# Patient Record
Sex: Male | Born: 1975 | Race: White | Hispanic: No | Marital: Single | Smoking: Current every day smoker
Health system: Southern US, Community
[De-identification: ages and names within clinical notes are randomized; demographics above are authoritative.]

---

## 2018-02-10 ENCOUNTER — Emergency Department (HOSPITAL_COMMUNITY): Payer: Self-pay

## 2018-02-10 ENCOUNTER — Inpatient Hospital Stay (HOSPITAL_COMMUNITY)
Admission: EM | Admit: 2018-02-10 | Discharge: 2018-02-12 | DRG: 494 | Disposition: A | Discharge status: Home / Self Care | Payer: Self-pay | Attending: Student | Admitting: Student

## 2018-02-10 ENCOUNTER — Other Ambulatory Visit: Payer: Self-pay

## 2018-02-10 ENCOUNTER — Encounter (HOSPITAL_COMMUNITY): Payer: Self-pay | Admitting: Emergency Medicine

## 2018-02-10 DIAGNOSIS — Y92410 Unspecified street and highway as the place of occurrence of the external cause: Secondary | ICD-10-CM

## 2018-02-10 DIAGNOSIS — F17210 Nicotine dependence, cigarettes, uncomplicated: Secondary | ICD-10-CM | POA: Diagnosis present

## 2018-02-10 DIAGNOSIS — S82392A Other fracture of lower end of left tibia, initial encounter for closed fracture: Secondary | ICD-10-CM | POA: Diagnosis present

## 2018-02-10 DIAGNOSIS — Y9351 Activity, roller skating (inline) and skateboarding: Secondary | ICD-10-CM

## 2018-02-10 DIAGNOSIS — S82892A Other fracture of left lower leg, initial encounter for closed fracture: Principal | ICD-10-CM | POA: Diagnosis present

## 2018-02-10 DIAGNOSIS — Z9889 Other specified postprocedural states: Secondary | ICD-10-CM

## 2018-02-10 DIAGNOSIS — Z8781 Personal history of (healed) traumatic fracture: Secondary | ICD-10-CM

## 2018-02-10 DIAGNOSIS — S82402A Unspecified fracture of shaft of left fibula, initial encounter for closed fracture: Secondary | ICD-10-CM | POA: Diagnosis present

## 2018-02-10 DIAGNOSIS — S82202A Unspecified fracture of shaft of left tibia, initial encounter for closed fracture: Secondary | ICD-10-CM

## 2018-02-10 DIAGNOSIS — T148XXA Other injury of unspecified body region, initial encounter: Secondary | ICD-10-CM

## 2018-02-10 MED ORDER — MORPHINE SULFATE (PF) 4 MG/ML IV SOLN
4.0000 mg | Freq: Once | INTRAVENOUS | Status: AC
  Administered 2018-02-10: 4 mg via INTRAVENOUS
  Filled 2018-02-10: qty 1

## 2018-02-10 MED ORDER — HYDROMORPHONE HCL 1 MG/ML IJ SOLN
0.5000 mg | INTRAMUSCULAR | Status: AC | PRN
  Administered 2018-02-11 (×2): 0.5 mg via INTRAVENOUS
  Filled 2018-02-10 (×2): qty 1

## 2018-02-10 MED ORDER — MORPHINE SULFATE (PF) 4 MG/ML IV SOLN: 4.0000 mg | Freq: Once | INTRAVENOUS | Status: DC

## 2018-02-10 MED ORDER — SODIUM CHLORIDE 0.9 % IV SOLN
INTRAVENOUS | Status: AC
  Administered 2018-02-11: 01:00:00 via INTRAVENOUS

## 2018-02-10 MED ORDER — ONDANSETRON HCL 4 MG/2ML IJ SOLN
4.0000 mg | Freq: Three times a day (TID) | INTRAMUSCULAR | Status: AC | PRN
  Administered 2018-02-10: 4 mg via INTRAVENOUS
  Filled 2018-02-10: qty 2

## 2018-02-10 NOTE — ED Triage Notes (Signed)
Pt BIB by [facility] for a fall from a skateboard. [facility] advised he was being pulled by his dog while on a skateboard when he fell. EMS reports there was deformity to the left tib/fib area. EMS reports a 20g IV in the pts left A/C, limb splinted, and vital signs stable.   Pt reports he was hauling a** while being pulled by his dog. Pt reports minimal pain.

## 2018-02-10 NOTE — ED Provider Notes (Signed)
[facility] EMERGENCY DEPARTMENT Provider Note   CSN: [id number] Arrival date & time: 02/10/18  2109     History   Chief Complaint Chief Complaint  Patient presents with  . Skateboard Accident    HPI Philip Page is a 42 y.o. male.  The history is provided by the patient and medical records. No language interpreter was used.  Leg Pain   This is a new problem. The current episode started 1 to 2 hours ago. The problem occurs constantly. The problem has not changed since onset.The pain is present in the left lower leg. The quality of the pain is described as constant and sharp. The pain is at a severity of 10/10. The pain is severe. Pertinent negatives include no numbness and no tingling. He has tried nothing for the symptoms. The treatment provided no relief. There has been a history of trauma.    History reviewed. No pertinent past medical history.  There are no active problems to display for this patient.   History reviewed. No pertinent surgical history.      Home Medications    Prior to Admission medications   Not on File    Family History No family history on file.  Social History Social History   Tobacco Use  . Smoking status: Current Every Day Smoker    Packs/day: 0.50    Types: Cigarettes  . Smokeless tobacco: Former Network engineer Use Topics  . Alcohol use: Yes    Alcohol/week: 3.6 oz    Types: 6 Cans of beer per week  . Drug use: Never     Allergies   Patient has no allergy information on record.   Review of Systems Review of Systems  Constitutional: Negative for activity change, chills, diaphoresis, fatigue and fever.  HENT: Negative for congestion and rhinorrhea.   Eyes: Negative for visual disturbance.  Respiratory: Negative for cough, chest tightness, shortness of breath, wheezing and stridor.   Cardiovascular: Negative for chest pain, palpitations and leg swelling.  Gastrointestinal: Negative for abdominal  distention, abdominal pain, blood in stool, constipation, diarrhea, nausea and vomiting.  Genitourinary: Negative for difficulty urinating, dysuria, flank pain and frequency.  Musculoskeletal: Negative for back pain, gait problem, neck pain and neck stiffness.  Skin: Positive for wound (abrasions). Negative for rash.  Neurological: Negative for dizziness, tingling, weakness, light-headedness, numbness and headaches.  Psychiatric/Behavioral: Negative for agitation and confusion.  All other systems reviewed and are negative.    Physical Exam Updated Vital Signs BP (!) 133/93 (BP Location: Right Arm)   Pulse 83   Temp 98.1 F (36.7 C) (Oral)   Resp 16   Ht 5' 8"  (1.727 m)   Wt 72.6 kg (160 lb)   SpO2 100%   BMI 24.33 kg/m   Physical Exam  Constitutional: He is oriented to person, place, and time. He appears well-developed and well-nourished. No distress.  HENT:  Head: Normocephalic and atraumatic.  Nose: Nose normal.  Mouth/Throat: Oropharynx is clear and moist. No oropharyngeal exudate.  Eyes: Pupils are equal, round, and reactive to light. Conjunctivae and EOM are normal.  Neck: Normal range of motion.  Cardiovascular: Normal rate and intact distal pulses.  No murmur heard. Pulmonary/Chest: Effort normal and breath sounds normal. No respiratory distress. He has no wheezes. He exhibits no tenderness.  Abdominal: Soft. He exhibits no distension. There is no tenderness.  Musculoskeletal: He exhibits tenderness and deformity. He exhibits no edema.       Left lower leg: He  exhibits tenderness, bony tenderness and deformity.       Legs: Neurological: He is alert and oriented to person, place, and time. No sensory deficit. He exhibits normal muscle tone.  Skin: Capillary refill takes less than 2 seconds. He is not diaphoretic. No erythema. No pallor.  Psychiatric: He has a normal mood and affect.  Nursing note and vitals reviewed.    ED Treatments / Results  Labs (all labs  ordered are listed, but only abnormal results are displayed) Labs Reviewed  CBC WITH DIFFERENTIAL/PLATELET - Abnormal; Notable for the following components:      Result Value   WBC 13.4 (*)    RBC 4.18 (*)    Neutro Abs 10.3 (*)    All other components within normal limits  PROTIME-INR  BASIC METABOLIC PANEL    EKG EKG Interpretation  Date/Time:  Sunday February 10 2018 23:32:55 EDT Ventricular Rate:  64 PR Interval:    QRS Duration: 91 QT Interval:  405 QTC Calculation: 418 R Axis:   84 Text Interpretation:  Sinus rhythm Atrial premature complex No prior ECG for comparison.  No STEMI Confirmed by Antony Blackbird [phone]) on 02/10/2018 11:37:59 PM   Radiology Dg Tibia/fibula Left  Result Date: 02/10/2018 CLINICAL DATA:  Skateboarding accident. EXAM: LEFT TIBIA AND FIBULA - 2 VIEW COMPARISON:  None. FINDINGS: Multiple comminuted oblique fractures of the distal shaft and metaphyseal region of the left tibia. There is about 10 mm lateral displacement and 11 mm anterior displacement of the distal fracture fragments. Minimal angulation posteriorly. Comminuted oblique fractures of the distal left fibular shaft with about 8 mm lateral and 5 mm posterior displacement of the distal fracture fragments. Mild overriding. Fracture lines do not appear to extend to the ankle joint. Soft tissue swelling. IMPRESSION: Multiple comminuted oblique fractures of the distal shaft and metaphyseal region of the left tibia and of the distal shaft of the left fibula. Electronically Signed   By: Lucienne Capers M.D.   On: 02/10/2018 22:55    Procedures Procedures (including critical care time)  Medications Ordered in ED Medications  morphine 4 MG/ML injection 4 mg (4 mg Intravenous Canceled Entry 02/10/18 2348)  0.9 %  sodium chloride infusion (has no administration in time range)  HYDROmorphone (DILAUDID) injection 0.5 mg (has no administration in time range)  ondansetron (ZOFRAN) injection 4 mg (4 mg  Intravenous Canceled Entry 02/10/18 2348)  morphine 4 MG/ML injection 4 mg (4 mg Intravenous Given 02/10/18 2343)     Initial Impression / Assessment and Plan / ED Course  I have reviewed the triage vital signs and the nursing notes.  Pertinent labs & imaging results that were available during my care of the patient were reviewed by me and considered in my medical decision making (see chart for details).     Nicolaas Savo is a 42 y.o. male with no significant past medical history who presents with left leg injury from skateboarding.  Patient reports that he was having his dog pulled him on a skateboard this evening when a squirrel ran out and caused him to change directions.  Patient jumped off the skateboard and landed on his left leg which then broke.  He reports that there was significant deformity and "floppiness" of the foot.  He reports some abrasions but the wound was not an open wound.  He denies head injury chest injury back or abdominal pain.  He denies any other complaints.  On exam, patient was found to have deformity of the  left tib-fib.  Injury appeared to be closed.  Patient had normal capillary refill, sensation, and had a palpable DP and PT pulse on exam when the leg was straight.  Patient could also wiggle his toes.  Multiple abrasions on both legs present.  Lungs clear chest nontender.  X-ray obtained showing a comminuted tib/fib fracture of the left leg.  Next  Orthopedics was called and recommended admission to their service, placement of a splint, and make the patient n.p.o.  Patient also had screening preop labs and EKG performed.  Patient given pain medicine and splint was applied by orthopedic tech.  Patient admitted to orthopedic service for further management and likely surgery tomorrow.   Final Clinical Impressions(s) / ED Diagnoses   Final diagnoses:  Tibia/fibula fracture, left, closed, initial encounter  Fall from skateboard, initial encounter    ED  Discharge Orders    None      Clinical Impression: 1. Tibia/fibula fracture, left, closed, initial encounter   2. Fall from skateboard, initial encounter     Disposition: Admit  This note was prepared with assistance of Systems analyst. Occasional wrong-word or sound-a-like substitutions may have occurred due to the inherent limitations of voice recognition software.     Tegeler, Gwenyth Allegra, MD 02/11/18 0030

## 2018-02-11 ENCOUNTER — Encounter (HOSPITAL_COMMUNITY)
Admission: EM | Disposition: A | Discharge status: Home / Self Care | Payer: Self-pay | Source: Home / Self Care | Attending: Student

## 2018-02-11 ENCOUNTER — Inpatient Hospital Stay (HOSPITAL_COMMUNITY): Payer: Self-pay | Admitting: Certified Registered Nurse Anesthetist

## 2018-02-11 ENCOUNTER — Inpatient Hospital Stay (HOSPITAL_COMMUNITY): Payer: Self-pay

## 2018-02-11 ENCOUNTER — Observation Stay (HOSPITAL_COMMUNITY): Payer: Self-pay

## 2018-02-11 DIAGNOSIS — S82202A Unspecified fracture of shaft of left tibia, initial encounter for closed fracture: Secondary | ICD-10-CM | POA: Diagnosis present

## 2018-02-11 DIAGNOSIS — S82402A Unspecified fracture of shaft of left fibula, initial encounter for closed fracture: Secondary | ICD-10-CM

## 2018-02-11 HISTORY — PX: OPEN REDUCTION INTERNAL FIXATION (ORIF) TIBIA/FIBULA FRACTURE: SHX5992

## 2018-02-11 LAB — CBC WITH DIFFERENTIAL/PLATELET
Abs Immature Granulocytes: 0 K/uL (ref 0.0–0.1)
Basophils Absolute: 0 K/uL (ref 0.0–0.1)
Basophils Relative: 0 %
Eosinophils Absolute: 0.1 K/uL (ref 0.0–0.7)
Eosinophils Relative: 0 %
HCT: 40.4 % (ref 39.0–52.0)
Hemoglobin: 14.1 g/dL (ref 13.0–17.0)
Immature Granulocytes: 0 %
Lymphocytes Relative: 17 %
Lymphs Abs: 2.2 K/uL (ref 0.7–4.0)
MCH: 33.7 pg (ref 26.0–34.0)
MCHC: 34.9 g/dL (ref 30.0–36.0)
MCV: 96.7 fL (ref 78.0–100.0)
Monocytes Absolute: 0.7 K/uL (ref 0.1–1.0)
Monocytes Relative: 6 %
Neutro Abs: 10.3 K/uL — ABNORMAL HIGH (ref 1.7–7.7)
Neutrophils Relative %: 77 %
Platelets: 245 K/uL (ref 150–400)
RBC: 4.18 MIL/uL — ABNORMAL LOW (ref 4.22–5.81)
RDW: 11.6 % (ref 11.5–15.5)
WBC: 13.4 K/uL — ABNORMAL HIGH (ref 4.0–10.5)

## 2018-02-11 LAB — BASIC METABOLIC PANEL WITH GFR
Anion gap: 10 (ref 5–15)
BUN: 7 mg/dL (ref 6–20)
CO2: 26 mmol/L (ref 22–32)
Calcium: 8.8 mg/dL — ABNORMAL LOW (ref 8.9–10.3)
Chloride: 103 mmol/L (ref 98–111)
Creatinine, Ser: 0.87 mg/dL (ref 0.61–1.24)
GFR calc Af Amer: >60 mL/min
GFR calc non Af Amer: >60 mL/min
Glucose, Bld: 102 mg/dL — ABNORMAL HIGH (ref 70–99)
Potassium: 4.5 mmol/L (ref 3.5–5.1)
Sodium: 139 mmol/L (ref 135–145)

## 2018-02-11 LAB — SURGICAL PCR SCREEN
MRSA, PCR: NEGATIVE
Staphylococcus aureus: NEGATIVE

## 2018-02-11 LAB — PROTIME-INR
INR: 1.01
Prothrombin Time: 13.2 seconds (ref 11.4–15.2)

## 2018-02-11 SURGERY — OPEN REDUCTION INTERNAL FIXATION (ORIF) TIBIA/FIBULA FRACTURE
Anesthesia: General | Site: Ankle | Laterality: Left

## 2018-02-11 MED ORDER — OXYCODONE-ACETAMINOPHEN 5-325 MG PO TABS
ORAL_TABLET | ORAL | Status: AC
  Filled 2018-02-11: qty 1

## 2018-02-11 MED ORDER — KETOROLAC TROMETHAMINE 15 MG/ML IJ SOLN
15.0000 mg | Freq: Four times a day (QID) | INTRAMUSCULAR | Status: AC | PRN
  Administered 2018-02-11: 15 mg via INTRAVENOUS
  Filled 2018-02-11: qty 1

## 2018-02-11 MED ORDER — SUCCINYLCHOLINE CHLORIDE 200 MG/10ML IV SOSY
PREFILLED_SYRINGE | INTRAVENOUS | Status: AC
  Filled 2018-02-11: qty 30

## 2018-02-11 MED ORDER — ROCURONIUM BROMIDE 10 MG/ML (PF) SYRINGE
PREFILLED_SYRINGE | INTRAVENOUS | Status: DC | PRN
  Administered 2018-02-11: 65 mg via INTRAVENOUS

## 2018-02-11 MED ORDER — METHOCARBAMOL 500 MG PO TABS
500.0000 mg | ORAL_TABLET | Freq: Four times a day (QID) | ORAL | Status: DC | PRN
  Administered 2018-02-11 – 2018-02-12 (×4): 500 mg via ORAL
  Filled 2018-02-11 (×4): qty 1

## 2018-02-11 MED ORDER — HYDROMORPHONE HCL 1 MG/ML IJ SOLN
1.0000 mg | INTRAMUSCULAR | Status: DC | PRN
  Administered 2018-02-11 – 2018-02-12 (×2): 1 mg via INTRAVENOUS
  Filled 2018-02-11 (×2): qty 1

## 2018-02-11 MED ORDER — CEFAZOLIN SODIUM-DEXTROSE 2-4 GM/100ML-% IV SOLN
2.0000 g | Freq: Three times a day (TID) | INTRAVENOUS | Status: DC
  Administered 2018-02-11 – 2018-02-12 (×2): 2 g via INTRAVENOUS
  Filled 2018-02-11 (×3): qty 100

## 2018-02-11 MED ORDER — KETOROLAC TROMETHAMINE 30 MG/ML IJ SOLN
INTRAMUSCULAR | Status: DC | PRN
  Administered 2018-02-11: 30 mg via INTRAVENOUS

## 2018-02-11 MED ORDER — SUGAMMADEX SODIUM 200 MG/2ML IV SOLN
INTRAVENOUS | Status: AC
  Filled 2018-02-11: qty 2

## 2018-02-11 MED ORDER — CEFAZOLIN SODIUM-DEXTROSE 2-4 GM/100ML-% IV SOLN
2.0000 g | INTRAVENOUS | Status: AC
  Administered 2018-02-11: 2 g via INTRAVENOUS
  Filled 2018-02-11 (×2): qty 100

## 2018-02-11 MED ORDER — VANCOMYCIN HCL 1000 MG IV SOLR
INTRAVENOUS | Status: AC
  Filled 2018-02-11: qty 1000

## 2018-02-11 MED ORDER — ACETAMINOPHEN 325 MG PO TABS
650.0000 mg | ORAL_TABLET | Freq: Four times a day (QID) | ORAL | Status: DC | PRN
  Administered 2018-02-11: 650 mg via ORAL
  Filled 2018-02-11: qty 2

## 2018-02-11 MED ORDER — DEXAMETHASONE SODIUM PHOSPHATE 10 MG/ML IJ SOLN
INTRAMUSCULAR | Status: DC | PRN
  Administered 2018-02-11: 10 mg via INTRAVENOUS

## 2018-02-11 MED ORDER — ONDANSETRON HCL 4 MG/2ML IJ SOLN
INTRAMUSCULAR | Status: AC
  Filled 2018-02-11: qty 2

## 2018-02-11 MED ORDER — FENTANYL CITRATE (PF) 250 MCG/5ML IJ SOLN
INTRAMUSCULAR | Status: DC | PRN
  Administered 2018-02-11 (×5): 50 ug via INTRAVENOUS

## 2018-02-11 MED ORDER — ONDANSETRON HCL 4 MG/2ML IJ SOLN
INTRAMUSCULAR | Status: DC | PRN
  Administered 2018-02-11: 4 mg via INTRAVENOUS

## 2018-02-11 MED ORDER — ROCURONIUM BROMIDE 10 MG/ML (PF) SYRINGE
PREFILLED_SYRINGE | INTRAVENOUS | Status: AC
  Filled 2018-02-11: qty 10

## 2018-02-11 MED ORDER — KETOROLAC TROMETHAMINE 15 MG/ML IJ SOLN
15.0000 mg | Freq: Four times a day (QID) | INTRAMUSCULAR | Status: DC
  Administered 2018-02-11 – 2018-02-12 (×3): 15 mg via INTRAVENOUS
  Filled 2018-02-11 (×3): qty 1

## 2018-02-11 MED ORDER — LACTATED RINGERS IV SOLN
INTRAVENOUS | Status: DC
  Administered 2018-02-11: 12:00:00 via INTRAVENOUS

## 2018-02-11 MED ORDER — EPINEPHRINE PF 1 MG/10ML IJ SOSY
PREFILLED_SYRINGE | INTRAMUSCULAR | Status: AC
  Filled 2018-02-11: qty 20

## 2018-02-11 MED ORDER — GABAPENTIN 100 MG PO CAPS
100.0000 mg | ORAL_CAPSULE | Freq: Three times a day (TID) | ORAL | Status: DC
Start: 2018-02-11 — End: 2018-02-12
  Administered 2018-02-11 – 2018-02-12 (×3): 100 mg via ORAL
  Filled 2018-02-11 (×3): qty 1

## 2018-02-11 MED ORDER — DOUBLE ANTIBIOTIC 500-10000 UNIT/GM EX OINT
TOPICAL_OINTMENT | CUTANEOUS | Status: AC
  Filled 2018-02-11: qty 1

## 2018-02-11 MED ORDER — LIDOCAINE 2% (20 MG/ML) 5 ML SYRINGE
INTRAMUSCULAR | Status: DC | PRN
  Administered 2018-02-11: 60 mg via INTRAVENOUS

## 2018-02-11 MED ORDER — POVIDONE-IODINE 10 % EX SWAB: 2.0000 "application " | Freq: Once | CUTANEOUS | Status: DC

## 2018-02-11 MED ORDER — SODIUM CHLORIDE 0.9 % IJ SOLN
INTRAMUSCULAR | Status: AC
  Filled 2018-02-11: qty 20

## 2018-02-11 MED ORDER — LIDOCAINE 2% (20 MG/ML) 5 ML SYRINGE
INTRAMUSCULAR | Status: AC
  Filled 2018-02-11: qty 5

## 2018-02-11 MED ORDER — SUGAMMADEX SODIUM 200 MG/2ML IV SOLN
INTRAVENOUS | Status: DC | PRN
  Administered 2018-02-11: 140 mg via INTRAVENOUS

## 2018-02-11 MED ORDER — FENTANYL CITRATE (PF) 250 MCG/5ML IJ SOLN
INTRAMUSCULAR | Status: AC
  Filled 2018-02-11: qty 5

## 2018-02-11 MED ORDER — OXYCODONE-ACETAMINOPHEN 5-325 MG PO TABS
2.0000 | ORAL_TABLET | Freq: Four times a day (QID) | ORAL | Status: DC | PRN
  Administered 2018-02-12 (×3): 2 via ORAL
  Filled 2018-02-11 (×3): qty 2

## 2018-02-11 MED ORDER — BUPIVACAINE HCL (PF) 0.5 % IJ SOLN
INTRAMUSCULAR | Status: AC
  Filled 2018-02-11: qty 30

## 2018-02-11 MED ORDER — CHLORHEXIDINE GLUCONATE 4 % EX LIQD: 60.0000 mL | Freq: Once | CUTANEOUS | Status: DC

## 2018-02-11 MED ORDER — PHENYLEPHRINE 40 MCG/ML (10ML) SYRINGE FOR IV PUSH (FOR BLOOD PRESSURE SUPPORT)
PREFILLED_SYRINGE | INTRAVENOUS | Status: AC
  Filled 2018-02-11: qty 20

## 2018-02-11 MED ORDER — BACITRACIN 500 UNIT/GM EX OINT
TOPICAL_OINTMENT | CUTANEOUS | Status: DC | PRN
  Administered 2018-02-11: 1 via TOPICAL

## 2018-02-11 MED ORDER — OXYCODONE-ACETAMINOPHEN 5-325 MG PO TABS: 1.0000 | ORAL_TABLET | ORAL | Status: DC | PRN

## 2018-02-11 MED ORDER — PROPOFOL 10 MG/ML IV BOLUS
INTRAVENOUS | Status: AC
  Filled 2018-02-11: qty 20

## 2018-02-11 MED ORDER — MIDAZOLAM HCL 2 MG/2ML IJ SOLN
INTRAMUSCULAR | Status: AC
  Filled 2018-02-11: qty 2

## 2018-02-11 MED ORDER — ACETAMINOPHEN 500 MG PO TABS
500.0000 mg | ORAL_TABLET | Freq: Two times a day (BID) | ORAL | Status: DC
  Administered 2018-02-11 – 2018-02-12 (×2): 500 mg via ORAL
  Filled 2018-02-11 (×2): qty 1

## 2018-02-11 MED ORDER — VANCOMYCIN HCL 1000 MG IV SOLR
INTRAVENOUS | Status: DC | PRN
  Administered 2018-02-11: 1000 mg

## 2018-02-11 MED ORDER — DEXAMETHASONE SODIUM PHOSPHATE 10 MG/ML IJ SOLN
INTRAMUSCULAR | Status: AC
  Filled 2018-02-11: qty 2

## 2018-02-11 MED ORDER — PROPOFOL 10 MG/ML IV BOLUS
INTRAVENOUS | Status: DC | PRN
  Administered 2018-02-11: 20 mg via INTRAVENOUS
  Administered 2018-02-11: 180 mg via INTRAVENOUS

## 2018-02-11 MED ORDER — 0.9 % SODIUM CHLORIDE (POUR BTL) OPTIME
TOPICAL | Status: DC | PRN
  Administered 2018-02-11: 1000 mL

## 2018-02-11 MED ORDER — OXYCODONE HCL 5 MG PO TABS
5.0000 mg | ORAL_TABLET | ORAL | Status: DC | PRN
  Administered 2018-02-11: 10 mg via ORAL
  Administered 2018-02-11: 5 mg via ORAL
  Filled 2018-02-11: qty 2
  Filled 2018-02-11: qty 1

## 2018-02-11 MED ORDER — ASPIRIN 325 MG PO TABS
325.0000 mg | ORAL_TABLET | Freq: Every day | ORAL | Status: DC
  Administered 2018-02-12: 325 mg via ORAL
  Filled 2018-02-11: qty 1

## 2018-02-11 MED ORDER — OXYCODONE-ACETAMINOPHEN 5-325 MG PO TABS
1.0000 | ORAL_TABLET | ORAL | Status: DC | PRN
Start: 2018-02-11 — End: 2018-02-12
  Administered 2018-02-11: 1 via ORAL

## 2018-02-11 MED ORDER — MIDAZOLAM HCL 2 MG/2ML IJ SOLN
INTRAMUSCULAR | Status: DC | PRN
  Administered 2018-02-11 (×2): 1 mg via INTRAVENOUS

## 2018-02-11 SURGICAL SUPPLY — 62 items
BANDAGE ACE 4X5 VEL STRL LF (GAUZE/BANDAGES/DRESSINGS) ×3 IMPLANT
BANDAGE ACE 6X5 VEL STRL LF (GAUZE/BANDAGES/DRESSINGS) ×3 IMPLANT
BANDAGE ESMARK 6X9 LF (GAUZE/BANDAGES/DRESSINGS) ×1 IMPLANT
BIT DRILL CALIBRATED 4.2 (BIT) ×1 IMPLANT
BIT DRILL SHORT 4.2 (BIT) ×2 IMPLANT
BNDG COHESIVE 4X5 TAN STRL (GAUZE/BANDAGES/DRESSINGS) IMPLANT
BNDG ESMARK 6X9 LF (GAUZE/BANDAGES/DRESSINGS) ×3
BRUSH SCRUB SURG 4.25 DISP (MISCELLANEOUS) ×6 IMPLANT
CHLORAPREP W/TINT 26ML (MISCELLANEOUS) ×3 IMPLANT
COVER SURGICAL LIGHT HANDLE (MISCELLANEOUS) ×3 IMPLANT
DRAPE C-ARM 42X72 X-RAY (DRAPES) ×3 IMPLANT
DRAPE C-ARMOR (DRAPES) ×3 IMPLANT
DRAPE ORTHO SPLIT 77X108 STRL (DRAPES) ×4
DRAPE SURG ORHT 6 SPLT 77X108 (DRAPES) ×2 IMPLANT
DRAPE U-SHAPE 47X51 STRL (DRAPES) ×3 IMPLANT
DRILL BIT CALIBRATED 4.2 (BIT) ×3
DRILL BIT SHORT 4.2 (BIT) ×4
DRSG ADAPTIC 3X8 NADH LF (GAUZE/BANDAGES/DRESSINGS) ×3 IMPLANT
ELECT REM PT RETURN 9FT ADLT (ELECTROSURGICAL) ×3
ELECTRODE REM PT RTRN 9FT ADLT (ELECTROSURGICAL) ×1 IMPLANT
GAUZE SPONGE 4X4 12PLY STRL (GAUZE/BANDAGES/DRESSINGS) ×3 IMPLANT
GLOVE BIO SURGEON STRL SZ7.5 (GLOVE) ×6 IMPLANT
GLOVE BIOGEL PI IND STRL 7.5 (GLOVE) ×1 IMPLANT
GLOVE BIOGEL PI INDICATOR 7.5 (GLOVE) ×2
GOWN STRL REUS W/ TWL LRG LVL3 (GOWN DISPOSABLE) ×2 IMPLANT
GOWN STRL REUS W/TWL LRG LVL3 (GOWN DISPOSABLE) ×4
GUIDEWIRE 3.2X400 (WIRE) ×3 IMPLANT
KIT TURNOVER KIT B (KITS) ×3 IMPLANT
MANIFOLD NEPTUNE II (INSTRUMENTS) ×3 IMPLANT
NAIL TIBIAL W/PROX BEND 10X345 (Nail) ×3 IMPLANT
NEEDLE 25GAX1.5 (MISCELLANEOUS) ×3 IMPLANT
NEEDLE HYPO 21X1.5 SAFETY (NEEDLE) IMPLANT
NS IRRIG 1000ML POUR BTL (IV SOLUTION) ×3 IMPLANT
PACK TOTAL JOINT (CUSTOM PROCEDURE TRAY) ×3 IMPLANT
PAD ARMBOARD 7.5X6 YLW CONV (MISCELLANEOUS) ×6 IMPLANT
PAD CAST 4YDX4 CTTN HI CHSV (CAST SUPPLIES) IMPLANT
PADDING CAST ABS 4INX4YD NS (CAST SUPPLIES) ×2
PADDING CAST ABS COTTON 4X4 ST (CAST SUPPLIES) ×1 IMPLANT
PADDING CAST COTTON 4X4 STRL (CAST SUPPLIES)
PADDING CAST COTTON 6X4 STRL (CAST SUPPLIES) ×3 IMPLANT
REAMER ROD DEEP FLUTE 2.5X950 (INSTRUMENTS) ×3 IMPLANT
SCREW LOCK STAR 5X30 (Screw) ×3 IMPLANT
SCREW LOCK STAR 5X32 (Screw) ×6 IMPLANT
SCREW LOCK STAR 5X34 (Screw) ×3 IMPLANT
SCREW LOCK STAR 5X40 (Screw) ×3 IMPLANT
SPLINT PLASTER CAST XFAST 5X30 (CAST SUPPLIES) ×2 IMPLANT
SPLINT PLASTER XFAST SET 5X30 (CAST SUPPLIES) ×4
SPONGE LAP 18X18 X RAY DECT (DISPOSABLE) IMPLANT
STAPLER VISISTAT 35W (STAPLE) ×3 IMPLANT
SUCTION FRAZIER HANDLE 10FR (MISCELLANEOUS) ×2
SUCTION TUBE FRAZIER 10FR DISP (MISCELLANEOUS) ×1 IMPLANT
SUT ETHILON 3 0 PS 1 (SUTURE) ×6 IMPLANT
SUT PROLENE 0 CT (SUTURE) IMPLANT
SUT VIC AB 0 CT1 27 (SUTURE) ×2
SUT VIC AB 0 CT1 27XBRD ANBCTR (SUTURE) ×1 IMPLANT
SUT VIC AB 2-0 CT1 27 (SUTURE) ×4
SUT VIC AB 2-0 CT1 TAPERPNT 27 (SUTURE) ×2 IMPLANT
SYR CONTROL 10ML LL (SYRINGE) ×3 IMPLANT
TOWEL OR 17X24 6PK STRL BLUE (TOWEL DISPOSABLE) ×3 IMPLANT
TOWEL OR 17X26 10 PK STRL BLUE (TOWEL DISPOSABLE) ×6 IMPLANT
UNDERPAD 30X30 (UNDERPADS AND DIAPERS) ×3 IMPLANT
WATER STERILE IRR 1000ML POUR (IV SOLUTION) ×3 IMPLANT

## 2018-02-11 NOTE — Anesthesia Procedure Notes (Signed)
Procedure Name: Intubation Date/Time: 02/11/2018 1:30 PM Performed by: Julieta Bellini, CRNA Pre-anesthesia Checklist: Patient identified, Emergency Drugs available, Suction available and Patient being monitored Patient Re-evaluated:Patient Re-evaluated prior to induction Oxygen Delivery Method: Circle system utilized Preoxygenation: Pre-oxygenation with 100% oxygen Induction Type: IV induction Ventilation: Mask ventilation without difficulty Laryngoscope Size: Mac and 4 Grade View: Grade I Tube type: Oral Tube size: 7.5 mm Number of attempts: 1 Airway Equipment and Method: Stylet Placement Confirmation: ETT inserted through vocal cords under direct vision,  positive ETCO2 and breath sounds checked- equal and bilateral Secured at: 22 cm Tube secured with: Tape Dental Injury: Teeth and Oropharynx as per pre-operative assessment

## 2018-02-11 NOTE — Anesthesia Postprocedure Evaluation (Signed)
Anesthesia Post Note  Patient: Philip Page  Procedure(s) Performed: OPEN REDUCTION INTERNAL FIXATION (ORIF) TIBIA/FIBULA FRACTURE (Left Ankle)     Patient location during evaluation: PACU Anesthesia Type: General Level of consciousness: awake Pain management: pain level controlled Vital Signs Assessment: post-procedure vital signs reviewed and stable Respiratory status: spontaneous breathing Cardiovascular status: stable Anesthetic complications: no    Last Vitals:  Vitals:   02/11/18 1506 02/11/18 1523  BP: (!) 154/87 (!) 157/88  Pulse: (!) 104 84  Resp: 18 13  Temp: 36.5 C   SpO2: 98% 99%    Last Pain:  Vitals:   02/11/18 1523  TempSrc:   PainSc: 4                  Viliami Bracco

## 2018-02-11 NOTE — Transfer of Care (Signed)
Immediate Anesthesia Transfer of Care Note  Patient: Philip Page  Procedure(s) Performed: OPEN REDUCTION INTERNAL FIXATION (ORIF) TIBIA/FIBULA FRACTURE (Left Ankle)  Patient Location: PACU  Anesthesia Type:General  Level of Consciousness: awake, alert , oriented and patient cooperative  Airway & Oxygen Therapy: Patient Spontanous Breathing  Post-op Assessment: Report given to RN, Post -op Vital signs reviewed and stable and Patient moving all extremities X 4  Post vital signs: Reviewed and stable  Last Vitals:  Vitals Value Taken Time  BP 154/87 02/11/2018  3:06 PM  Temp    Pulse 103 02/11/2018  3:08 PM  Resp 18 02/11/2018  3:08 PM  SpO2 100 % 02/11/2018  3:08 PM  Vitals shown include unvalidated device data.  Last Pain:  Vitals:   02/11/18 1024  TempSrc:   PainSc: 3       Patients Stated Pain Goal: 3 (21/11/73 5670)  Complications: No apparent anesthesia complications

## 2018-02-11 NOTE — Progress Notes (Signed)
Pt arrived via stretcher to 5N A&O x4. Pt is in stable condition. Given Dilaudid and states he is still having throbbing pains. MD on call contacted. Will continue to monitor pt.

## 2018-02-11 NOTE — Anesthesia Preprocedure Evaluation (Signed)
Anesthesia Evaluation  Patient identified by MRN, date of birth, ID band Patient awake    Reviewed: Allergy & Precautions, NPO status   Airway Mallampati: II       Dental   Pulmonary Current Smoker,    breath sounds clear to auscultation       Cardiovascular negative cardio ROS   Rhythm:Regular Rate:Normal     Neuro/Psych    GI/Hepatic negative GI ROS, Neg liver ROS,   Endo/Other  negative endocrine ROS  Renal/GU negative Renal ROS     Musculoskeletal   Abdominal   Peds  Hematology   Anesthesia Other Findings   Reproductive/Obstetrics                             Anesthesia Physical Anesthesia Plan  ASA: II  Anesthesia Plan: General   Post-op Pain Management:    Induction: Intravenous  PONV Risk Score and Plan: Treatment may vary due to age or medical condition, Ondansetron, Dexamethasone and Midazolam  Airway Management Planned: Oral ETT  Additional Equipment:   Intra-op Plan:   Post-operative Plan: Extubation in OR  Informed Consent: I have reviewed the patients History and Physical, chart, labs and discussed the procedure including the risks, benefits and alternatives for the proposed anesthesia with the patient or authorized representative who has indicated his/her understanding and acceptance.   Dental advisory given  Plan Discussed with: Anesthesiologist and CRNA  Anesthesia Plan Comments:         Anesthesia Quick Evaluation

## 2018-02-11 NOTE — Progress Notes (Signed)
Patient seen and examined. Comminuted tibia and fibular fracture. I recommend IMN and limited ORIF. Discussed risks and benefits. Risks discussed included bleeding requiring blood transfusion, bleeding causing a hematoma, infection, malunion, nonunion, damage to surrounding nerves and blood vessels, pain, hardware prominence or irritation, hardware failure, stiffness, post-traumatic arthritis, DVT/PE, compartment syndrome. Patient agrees to proceed with surgery and consent obtained.  Shona Needles, MD [facility] [phone] (phone)

## 2018-02-11 NOTE — H&P (Signed)
Philip Page is an 42 y.o. male.   Chief Complaint: left leg pain HPI: 42 y/o male without significant PMH c/o left leg pain since he fell on a skateboard yesterday evening.  He c/o aching pain in the left leg that is worse with motion and better with rest.  He denies any previous injury to the left ankle or leg.  Splinted in the ER last night.  He is not diabetic.  He smokes 1/2 ppd cigarettes.  He works Secretary/administrator.    PMH / PSH:  none  FH:  noncontributary  Social History:  reports that he has been smoking cigarettes.  He has been smoking about 0.50 packs per day. He has quit using smokeless tobacco. He reports that he drinks about 3.6 oz of alcohol per week. He reports that he does not use drugs.  Allergies: No Known Allergies  Medications Prior to Admission  Medication Sig Dispense Refill  . Aspirin-Acetaminophen-Caffeine (GOODYS EXTRA STRENGTH PO) Take 1 Package by mouth daily as needed (pain).    . naproxen sodium (ALEVE) 220 MG tablet Take 220 mg by mouth daily as needed (pain).      Results for orders placed or performed during the hospital encounter of 02/10/18 (from the past 48 hour(s))  CBC with Differential     Status: Abnormal   Collection Time: 02/11/18 12:03 AM  Result Value Ref Range   WBC 13.4 (H) 4.0 - 10.5 K/uL   RBC 4.18 (L) 4.22 - 5.81 MIL/uL   Hemoglobin 14.1 13.0 - 17.0 g/dL   HCT 40.4 39.0 - 52.0 %   MCV 96.7 78.0 - 100.0 fL   MCH 33.7 26.0 - 34.0 pg   MCHC 34.9 30.0 - 36.0 g/dL   RDW 11.6 11.5 - 15.5 %   Platelets 245 150 - 400 K/uL   Neutrophils Relative % 77 %   Neutro Abs 10.3 (H) 1.7 - 7.7 K/uL   Lymphocytes Relative 17 %   Lymphs Abs 2.2 0.7 - 4.0 K/uL   Monocytes Relative 6 %   Monocytes Absolute 0.7 0.1 - 1.0 K/uL   Eosinophils Relative 0 %   Eosinophils Absolute 0.1 0.0 - 0.7 K/uL   Basophils Relative 0 %   Basophils Absolute 0.0 0.0 - 0.1 K/uL   Immature Granulocytes 0 %   Abs Immature Granulocytes 0.0 0.0 - 0.1 K/uL   Comment: Performed at [facility], [address] [place], [place] [address]  Basic metabolic panel     Status: Abnormal   Collection Time: 02/11/18 12:03 AM  Result Value Ref Range   Sodium 139 135 - 145 mmol/L   Potassium 4.5 3.5 - 5.1 mmol/L   Chloride 103 98 - 111 mmol/L    Comment: Please note change in reference range.   CO2 26 22 - 32 mmol/L   Glucose, Bld 102 (H) 70 - 99 mg/dL    Comment: Please note change in reference range.   BUN 7 6 - 20 mg/dL    Comment: Please note change in reference range.   Creatinine, Ser 0.87 0.61 - 1.24 mg/dL   Calcium 8.8 (L) 8.9 - 10.3 mg/dL   GFR calc non Af Amer >60 >60 mL/min   GFR calc Af Amer >60 >60 mL/min    Comment: (NOTE) The eGFR has been calculated using the CKD EPI equation. This calculation has not been validated in all clinical situations. eGFR's persistently <60 mL/min signify possible Chronic Kidney Disease.    Anion  gap 10 5 - 15    Comment: Performed at [facility], [place] [address], [place], [place] [address]  Protime-INR     Status: None   Collection Time: 02/11/18 12:03 AM  Result Value Ref Range   Prothrombin Time 13.2 11.4 - 15.2 seconds   INR 1.01     Comment: Performed at [facility], [place] [address], [place], [place] [address]  Surgical pcr screen     Status: None   Collection Time: 02/11/18  1:13 AM  Result Value Ref Range   MRSA, PCR NEGATIVE NEGATIVE   Staphylococcus aureus NEGATIVE NEGATIVE    Comment: (NOTE) The Xpert SA Assay (FDA approved for NASAL specimens in patients 30 years of age and older), is one component of a comprehensive surveillance program. It is not intended to diagnose infection nor to guide or monitor treatment. Performed at [facility], [place] [address], [place], [place] [address]    Dg Tibia/fibula Left  Result Date: 02/10/2018 CLINICAL DATA:  Skateboarding accident. EXAM: LEFT TIBIA AND FIBULA - 2 VIEW COMPARISON:  None. FINDINGS: Multiple comminuted  oblique fractures of the distal shaft and metaphyseal region of the left tibia. There is about 10 mm lateral displacement and 11 mm anterior displacement of the distal fracture fragments. Minimal angulation posteriorly. Comminuted oblique fractures of the distal left fibular shaft with about 8 mm lateral and 5 mm posterior displacement of the distal fracture fragments. Mild overriding. Fracture lines do not appear to extend to the ankle joint. Soft tissue swelling. IMPRESSION: Multiple comminuted oblique fractures of the distal shaft and metaphyseal region of the left tibia and of the distal shaft of the left fibula. Electronically Signed   By: Lucienne Capers M.D.   On: 02/10/2018 22:55    ROS  No recent f/c/n/v/wt loss  Blood pressure (!) 151/94, pulse 77, temperature 98.4 F (36.9 C), temperature source Oral, resp. rate 16, height 5' 8"  (1.727 m), weight 72.6 kg (160 lb), SpO2 100 %. Physical Exam  wn wd male in nad.  A and O x 4.  Mood and affect normal.  EOMI.  resp unlabored.  L leg with intact skin (by report).  Brisk cap refill at the toes.  Sens to LT intact at the dorsal and plantar forefoot.  5/5 strenght in PF and DF of the toes.    Assessment/Plan L tibial shaft fracture - plan OR today for IM nail.  I'll ask Dr. Marcelino Scot or Haddix to take over.  NPO for now.  Hold blood thinners pending surgery.  I explained the nature of the injury and proposed treatment plan to the patient in detail.  Wylene Simmer, MD 02/11/2018, 7:13 AM

## 2018-02-11 NOTE — Op Note (Signed)
OrthopaedicSurgeryOperativeNote (YQM:[id number]) Date of Surgery: 02/11/2018  Admit Date: 02/10/2018   Diagnoses: Pre-Op Diagnoses: Left tibia/fibular shaft fracture Left posterior malleolus fracture  Post-Op Diagnosis: Same  Procedures: 1. CPT 27759-Intramedullary nailing of left tibia fracture 2. CPT 27769-Percutaneous fixation of left posterior malleolus fracture  Surgeons: Primary: Shona Needles, MD   Location:[facility] OR ROOM 08   AnesthesiaChoice   Antibiotics:Ancef 2g preop   Tourniquettime: None.  [id number]:64 mL   Complications:None  Specimens:None  Implants: Implant Name Type Inv. Item Serial No. Manufacturer Lot No. LRB No. Used Action  NAIL TIBIAL W/PROX BEND [id number] - [id number] Nail NAIL TIBIAL W/PROX BEND 10X345  [employer] [id number] Left 1 Implanted  SCREW LOCK STAR 5X30 - [id number] Screw SCREW LOCK STAR 5X30  [employer]  Left 1 Implanted  SCREW LOCK STAR 5X40 - [id number] Screw SCREW LOCK STAR 5X40  [employer]  Left 1 Implanted  SCREW LOCK STAR 5X32 - [id number] Screw SCREW LOCK STAR 5X32  [employer]  Left 2 Implanted  SCREW LOCK STAR 5X34 - [id number] Screw SCREW LOCK STAR 5X34  [employer]  Left 1 Implanted    IndicationsforSurgery: 42 year old male who injured his leg fallen off the skateboard.  He sustained a left tibial shaft with associated fibula fracture.  He also had extension into his plafond with a posterior malleolus fracture.  Due to his young age and displacement I felt that proceeding with surgical fixation with intramedullary nailing and percutaneous fixation of his posterior malleolus was most appropriate.  I discussed risks and benefits with the patient. Risks discussed included bleeding requiring blood transfusion, bleeding causing a hematoma, infection, malunion, nonunion, damage to surrounding nerves and blood vessels, pain, hardware prominence or irritation, hardware failure, stiffness, post-traumatic  arthritis, DVT/PE, compartment syndrome, and even death.  Patient agreed to proceed with surgery and consent was obtained.  Operative Findings: 1.  Percutaneous fixation of posterior malleolus fracture using a [employer]ynthes 3.5 mm low-profile cortex screw 2.  Intramedullary nailing of left tibial shaft fracture using Synthes 10 x 345 mm EX nail with 3 distal and 2 proximal interlocks  Procedure: The patient was identified in the preoperative holding area. Consent was confirmed with the patient and their family and all questions were answered. The operative extremity was marked after confirmation with the patient. They were then brought back to the operating room by our anesthesia colleagues.  The patient was placed under general anesthetic and then carefully transferred over to a radiolucent flat top table.  Here a bump was placed under the operative hip and a bone foam was placed under the operative extremity.  The operative extremity was then prepped and draped in usual sterile fashion. A preoperative timeout was performed to verify the patient, the procedure, and the extremity. Preoperative antibiotics were dosed.  Fluoroscopic imaging was obtained to show the displacement of the fracture.  I first started out with percutaneous fixation of the posterior malleolus fracture to prevent displacement while intramedullary nailing was performed.  A small incision was made over the anterior lateral aspect of the tibial plafond.  Soft tissue was spread to access the bone.  Using fluoroscopy as a guide I then used a 2.5 mm drill bit to drill anterior to posterior and measured and placed a low-profile 3.5 millimeter screw to provide fixation to this posterior malleolus fragment.  Fluoroscopy was used to confirm adequate length and placement of the screw.  I then made a lateral parapatellar incision carried down through skin and subcutaneous tissue just lateral  to the patellar tendon.  I released a portion of the  lateral retinaculum but stayed extra-articular outside the capsule.  I excised the anterior fat pad and then proceeded to place a guidepin under fluoroscopic imaging to confirm adequate placement in both the AP and lateral views.  I then advanced a wire into the proximal metaphysis of the tibia.  I then used an entry reamer to enter the canal.  I passed a bent ball-tipped guidewire down the center of the canal and was able to pass in the distal segment after performing a closed reduction maneuver.  I seated it down into the physeal scar.  I then measured the length of the nail and I chose a 345 mm nail.  I then proceeded to sequentially ream up from 8.5 mm to 11 mm.  I obtained excellent chatter and I chose to place an 10 mm nail. I held the fracture reduced while nailing to prevent displacement of the fracture while placing the nail I then placed nail across the fracture into the distal segment.  The fracture had excellent reduction after the nail was placed.  I seated the nail to where it was slightly buried at the lateral of the knee.  I then placed 1 distal interlocking screws from medial to lateral using perfect circle technique.  I carefully placed one interlock from anterior to posterior after making an incision and spreading down to bone to prevent damage to the neurovascular bundle. An oblique distal screw was also placed  I then used my proximal jig to place 2 proximal interlocking screws through percutaneous incisions.  I removed the jig and obtained final fluoroscopic imaging.  The incisions were copiously irrigated. A gram of vancomycin powder was placed and I closed the lateral parapatellar incision with 0 Vicryl, 2-0 Vicryl and 3-0 nylon.  The remainder the incisions were closed with 3-0 nylon. A sterile dressing was placed and a well padded short leg splint was placed as well. The patient was then awoken from anesthesia and taken to the PACU in stable condition.   Post Op  Plan/Instructions: Patient will be nonweightbearing. Aspirin 373m daily for DVT prophylaxis. Postoperative Ancef. Mobilize with physical therapy and likely discharge home postoperative day 1.  I was present and performed the entire surgery.  KKatha Hamming MD [facility]

## 2018-02-11 NOTE — Progress Notes (Signed)
Pt transported down to short stay

## 2018-02-11 NOTE — Consult Note (Signed)
Orthopaedic Trauma Service (OTS) Consult   Patient ID: Philip Page MRN: [id number] DOB/AGE: Aug 05, 1975 42 y.o.  Reason for Consult:Left tibia fracture Referring Physician: Wylene Simmer, MD [facility]  HPI: Philip Page is an 42 y.o. male who is being seen in consultation at the request of Dr. Doran Durand for evaluation of left tibial shaft fracture.  According to the patient he was skateboarding with his dog on a leash ahead of him.  His dog went after to change something causing him to crash.  He snapped his leg and had immediate pain and deformity.  He was unable to bear weight and was brought to the emergency room.  X-rays show a tibial shaft fracture he was placed in a splint and admitted for surgical fixation.  I was asked to take over his care by Dr. Doran Durand for the complexity and need for an orthopedic traumatologist.  The patient is otherwise healthy.  He works in [employer].  He smokes about a pack a day of cigarettes.  Denies any illicit drug use.  He lives alone in a [place] house.  He does not use any ambulatory device.  He is otherwise in good health.  History reviewed. No pertinent past medical history.  History reviewed. No pertinent surgical history.  No family history on file.  Social History:  reports that he has been smoking cigarettes.  He has been smoking about 0.50 packs per day. He has quit using smokeless tobacco. He reports that he drinks about 3.6 oz of alcohol per week. He reports that he does not use drugs.  Allergies: No Known Allergies  Medications:  No current facility-administered medications on file prior to encounter.    Current Outpatient Medications on File Prior to Encounter  Medication Sig Dispense Refill  . Aspirin-Acetaminophen-Caffeine (GOODYS EXTRA STRENGTH PO) Take 1 Package by mouth daily as needed (pain).    . naproxen sodium (ALEVE) 220 MG tablet Take 220 mg by mouth daily as needed (pain).      ROS: Constitutional: No fever or  chills Vision: No changes in vision ENT: No difficulty swallowing CV: No chest pain Pulm: No SOB or wheezing GI: No nausea or vomiting GU: No urgency or inability to hold urine Skin: No poor wound healing Neurologic: No numbness or tingling Psychiatric: No depression or anxiety Heme: No bruising Allergic: No reaction to medications or food   Exam: Blood pressure (!) 151/94, pulse 77, temperature 98.4 F (36.9 C), temperature source Oral, resp. rate 16, height 5' 8"  (1.727 m), weight 72.6 kg (160 lb), SpO2 100 %. General: No acute distress Orientation: Awake alert and oriented x3 Mood and Affect: Cooperative and pleasant Gait: Not assessed due to his fracture Coordination and balance: Within normal limits   Left lower extremity: Splint is in place that is clean dry and intact.  His compartments are soft and compressible.  Deformity about his knee or hip.  Is active motor and sensory function to all nerve distributions and toes.  He has sensation intact to the dorsal and plantar aspect of his foot.  Warm well-perfused foot.  Right lower extremity: Skin without lesions. No tenderness to palpation. Full painless ROM, full strength in each muscle groups without evidence of instability.   Medical Decision Making: Imaging: X-rays and CT scan of the left tibia and ankle are reviewed.  It shows a comminuted midshaft tibia fracture with extension into the tibial plafond.  There is a single posterior malleolus fracture line that is nondisplaced.  The lateral malleolus fracture  is a Weber C but it is questionable whether this involves the ankle joint itself.  Labs:  Results for orders placed or performed during the hospital encounter of 02/10/18 (from the past 24 hour(s))  CBC with Differential     Status: Abnormal   Collection Time: 02/11/18 12:03 AM  Result Value Ref Range   WBC 13.4 (H) 4.0 - 10.5 K/uL   RBC 4.18 (L) 4.22 - 5.81 MIL/uL   Hemoglobin 14.1 13.0 - 17.0 g/dL   HCT 40.4 39.0  - 52.0 %   MCV 96.7 78.0 - 100.0 fL   MCH 33.7 26.0 - 34.0 pg   MCHC 34.9 30.0 - 36.0 g/dL   RDW 11.6 11.5 - 15.5 %   Platelets 245 150 - 400 K/uL   Neutrophils Relative % 77 %   Neutro Abs 10.3 (H) 1.7 - 7.7 K/uL   Lymphocytes Relative 17 %   Lymphs Abs 2.2 0.7 - 4.0 K/uL   Monocytes Relative 6 %   Monocytes Absolute 0.7 0.1 - 1.0 K/uL   Eosinophils Relative 0 %   Eosinophils Absolute 0.1 0.0 - 0.7 K/uL   Basophils Relative 0 %   Basophils Absolute 0.0 0.0 - 0.1 K/uL   Immature Granulocytes 0 %   Abs Immature Granulocytes 0.0 0.0 - 0.1 K/uL  Basic metabolic panel     Status: Abnormal   Collection Time: 02/11/18 12:03 AM  Result Value Ref Range   Sodium 139 135 - 145 mmol/L   Potassium 4.5 3.5 - 5.1 mmol/L   Chloride 103 98 - 111 mmol/L   CO2 26 22 - 32 mmol/L   Glucose, Bld 102 (H) 70 - 99 mg/dL   BUN 7 6 - 20 mg/dL   Creatinine, Ser 0.87 0.61 - 1.24 mg/dL   Calcium 8.8 (L) 8.9 - 10.3 mg/dL   GFR calc non Af Amer >60 >60 mL/min   GFR calc Af Amer >60 >60 mL/min   Anion gap 10 5 - 15  Protime-INR     Status: None   Collection Time: 02/11/18 12:03 AM  Result Value Ref Range   Prothrombin Time 13.2 11.4 - 15.2 seconds   INR 1.01   Surgical pcr screen     Status: None   Collection Time: 02/11/18  1:13 AM  Result Value Ref Range   MRSA, PCR NEGATIVE NEGATIVE   Staphylococcus aureus NEGATIVE NEGATIVE   Medical history and chart was reviewed  Assessment/Plan: 42 year old male with a left third tibial shaft fracture and associated posterior malleolus fracture  I recommended proceeding with a surgical fixation.  I recommended percutaneous fixation of his posterior malleolus fracture with intramedullary nailing of his tibia fracture.  I discussed at length the risks and benefits with the patient.Risks discussed included bleeding requiring blood transfusion, bleeding causing a hematoma, infection, malunion, nonunion, damage to surrounding nerves and blood vessels, pain, hardware  prominence or irritation, hardware failure, stiffness, post-traumatic arthritis, DVT/PE, compartment syndrome, and even death.  I discussed his postoperative course including nonweightbearing for likely 6 weeks.  We will likely keep him postoperatively for observation with IV antibiotics and physical therapy.   Shona Needles, MD [facility] [phone] (phone)

## 2018-02-12 ENCOUNTER — Encounter (HOSPITAL_COMMUNITY): Payer: Self-pay | Admitting: Student

## 2018-02-12 LAB — CBC
HEMATOCRIT: 34.1 % — AB (ref 39.0–52.0)
Hemoglobin: 11.4 g/dL — ABNORMAL LOW (ref 13.0–17.0)
MCH: 33.1 pg (ref 26.0–34.0)
MCHC: 33.4 g/dL (ref 30.0–36.0)
MCV: 99.1 fL (ref 78.0–100.0)
PLATELETS: 212 10*3/uL (ref 150–400)
RBC: 3.44 MIL/uL — ABNORMAL LOW (ref 4.22–5.81)
RDW: 11.9 % (ref 11.5–15.5)
WBC: 10.9 10*3/uL — AB (ref 4.0–10.5)

## 2018-02-12 LAB — HIV ANTIBODY (ROUTINE TESTING W REFLEX): HIV SCREEN 4TH GENERATION: NONREACTIVE

## 2018-02-12 MED ORDER — OXYCODONE-ACETAMINOPHEN 5-325 MG PO TABS: 1.0000 | ORAL_TABLET | ORAL | 0 refills | Status: AC | PRN

## 2018-02-12 MED ORDER — ASPIRIN 325 MG PO TABS: 325.0000 mg | ORAL_TABLET | Freq: Every day | ORAL | 0 refills | Status: AC

## 2018-02-12 MED ORDER — METHOCARBAMOL 750 MG PO TABS: 750.0000 mg | ORAL_TABLET | Freq: Four times a day (QID) | ORAL | 0 refills | Status: AC

## 2018-02-12 NOTE — Discharge Summary (Signed)
Orthopaedic Trauma Service (OTS)  Patient ID: Philip Page MRN: [id number] DOB/AGE: 43/20/1977 43 y.o.  Admit date: 02/10/2018 Discharge date: 02/12/2018  Admission Diagnoses: Closed fracture of posterior malleolus, left, initial encounter   Closed fracture of shaft of tibia and fibula, left, initial encounter  Discharge Diagnoses:  Active Problems:   Closed fracture of posterior malleolus, left, initial encounter   Closed fracture of shaft of tibia and fibula, left, initial encounter   History reviewed. No pertinent past medical history.  Procedures Performed: 02/11/18: CPT 27759-Intramedullary nailing of left tibia fracture 2. CPT 27769-Percutaneous fixation of left posterior malleolus fracture  Discharged Condition: good  Hospital Course: Patient was admitted for the above surgery. He did well following the surgery. His pain was controlled with oral medications. He was tolerating a regular diet. He was cleared by physical therapy and discharged home on postoperative day 1 in good condition  Consults: None  Significant Diagnostic Studies: None  Treatments: surgery: as above  Discharge Exam: Gen: NAD, AAOx3 LLE: Splint and dressing c/d/i. +EHL/FHL. Endorses sensation to dorsum and plantar aspect of foot. Warm and well perfused. Compartments soft and compressible  Disposition: Home   Allergies as of 02/12/2018   No Known Allergies     Medication List    STOP taking these medications   GOODYS EXTRA STRENGTH PO   naproxen sodium 220 MG tablet Commonly known as:  ALEVE     TAKE these medications   aspirin 325 MG tablet Take 1 tablet (325 mg total) by mouth daily.   methocarbamol 750 MG tablet Commonly known as:  ROBAXIN-750 Take 1 tablet (750 mg total) by mouth 4 (four) times daily.   oxyCODONE-acetaminophen 5-325 MG tablet Commonly known as:  PERCOCET/ROXICET Take 1 tablet by mouth every 4 (four) hours as needed for moderate pain.      Follow-up  Information    Sharlee Rufino, Thomasene Lot, MD. Schedule an appointment as soon as possible for a visit in 2 week(s).   Specialty:  Orthopedic Surgery Contact information: [address] [address] [phone]           Discharge Instructions and Plan: Patient will be nonweightbearing to left leg. Aspirin for DVT prophylaxis. Follow up in 2 weeks for suture removal and x-rays.  Signed:  Shona Needles, MD [facility] 02/12/2018, 7:26 AM

## 2018-02-12 NOTE — Progress Notes (Signed)
Orthopaedic Trauma Progress Note  S: Doing well, pain controlled. No issues overnight  O:  Vitals:   02/12/18 0033 02/12/18 0500  BP: 129/74 125/77  Pulse: 69 (!) 53  Resp: 18 17  Temp: 97.7 F (36.5 C) 97.6 F (36.4 C)  SpO2: 100% 100%    Gen: NAD, AAOx3 LLE: Splint and dressing c/d/i. +EHL/FHL. Endorses sensation to dorsum and plantar aspect of foot. Warm and well perfused. Compartments soft and compressible  Imaging: Stable postoperative imaging. Appropriate hardware  Labs:  Results for orders placed or performed during the hospital encounter of 02/10/18 (from the past 24 hour(s))  CBC     Status: Abnormal   Collection Time: 02/12/18  4:41 AM  Result Value Ref Range   WBC 10.9 (H) 4.0 - 10.5 K/uL   RBC 3.44 (L) 4.22 - 5.81 MIL/uL   Hemoglobin 11.4 (L) 13.0 - 17.0 g/dL   HCT 34.1 (L) 39.0 - 52.0 %   MCV 99.1 78.0 - 100.0 fL   MCH 33.1 26.0 - 34.0 pg   MCHC 33.4 30.0 - 36.0 g/dL   RDW 11.9 11.5 - 15.5 %   Platelets 212 150 - 400 K/uL    Assessment: 42 year old male s/p skateboard injury  Injuries: Left tibial shaft fracture with posterior malleolus s/p IMN  Weightbearing: NWB LLE  Insicional and dressing care: Keep dressing clean, dry and intact until follow up  Orthopedic device(s):Splint  CV/Blood loss:hemodynamically stable, Hgb 11.4  Pain management: 1. Percocet 1-2 tabs q 4-6 hours PRN 2. Robaxin 523m q6hr PRN spasms 3. Toradol 169mq6 hours 4. Dilaudid 1 mg q 3hrs PRN 5. Gabapentin 10064mID 6. Tylenol 500m84m12 hours  VTE prophylaxis: Aspirin 325mg68mly to start today  ID: 24 hours ancef  Foley/Lines: No foley, medlock  Medical co-morbidities: None  Impediments to Fracture Healing: None  Dispo: PT eval this AM, likely discharge today  Follow - up plan: 2 weeks  KevinShona NeedlesOr[facility] [phone])

## 2018-02-12 NOTE — Care Management Note (Signed)
Case Management Note  Patient Details  Name: Philip Page MRN: [id number] Date of Birth: 1975-10-22  Subjective/Objective:   42 yr old gentleman s/p IM Nailing of left Tib/fib fracture.   Action/Plan: Case manager ordered DME for patient, no further [place] needs.    Expected Discharge Date:  02/12/18               Expected Discharge Plan:  Home/Self Care  In-House Referral:  NA  Discharge planning Services  CM Consult  Post Acute Care Choice:  Durable Medical Equipment Choice offered to:  NA  DME Arranged:  3-N-1, [place] rolling DME Agency:  [place]., NA  HH Arranged:  NA HH Agency:  NA  Status of Service:  Completed, signed off  If discussed at [place] of Stay Meetings, dates discussed:    Additional Comments:  Ninfa Meeker, RN 02/12/2018, 11:43 AM

## 2018-02-12 NOTE — Progress Notes (Signed)
Patient discharging today. Discharge instructions explained to patient and he verbalized understanding. No c/o pain or discomfort upon discharge. Took all personal belongings. No further questions or concerns voiced.

## 2018-02-12 NOTE — Plan of Care (Signed)
  Problem: Education: Goal: Knowledge of General Education information will improve Description Including pain rating scale, medication(s)/side effects and non-pharmacologic comfort measures Outcome: Progressing   Problem: Activity: Goal: Risk for activity intolerance will decrease Outcome: Progressing   Problem: Pain Managment: Goal: General experience of comfort will improve Outcome: Progressing   Problem: Safety: Goal: Ability to remain free from injury will improve Outcome: Progressing   Problem: Physical Regulation: Goal: Postoperative complications will be avoided or minimized Outcome: Progressing

## 2018-02-12 NOTE — Evaluation (Addendum)
Physical Therapy Evaluation & Discharge Patient Details Name: Philip Page MRN: [id number] DOB: 06/20/1976 Today's Date: 02/12/2018   History of Present Illness  Philip Page presents with a Tib/Fib fracture that was fixated with an IM Nail, and a Posterior Malleolar Fracture after falling off a skateboard (s/p ORIF). PMH includes tobacco and alcohol use.    Clinical Impression  Pt presents with problems above and deficits below. Pt was educated on precautions and safe use of DME.Pt ambulated 200 ft with RW with MinG-Supervision while following precautions. Overall steady with all mobility and no LOB noted throughout session. Due to patient's independence, willingness to follow precautions, and safety awareness, we will sign off from an acute PT stand point. If needs change, please reconsult.     Follow Up Recommendations No PT follow up    Equipment Recommendations  Rolling walker with 5" wheels;3in1 (PT)    Recommendations for Other Services       Precautions / Restrictions Precautions Precautions: Fall Restrictions Weight Bearing Restrictions: Yes LLE Weight Bearing: Non weight bearing      Mobility  Bed Mobility Overal bed mobility: Independent             General bed mobility comments: Pt sat up without cues or supervision as PT entered the room  Transfers Overall transfer level: Needs assistance Equipment used: Rolling walker (2 wheeled) Transfers: Sit to/from Stand Sit to Stand: Supervision;Min guard         General transfer comment: Pt transferred following precautions using RW with MinG-Supervision. Pt was able to perform without visible LOB or exertion.   Ambulation/Gait Ambulation/Gait assistance: Supervision;Min guard Gait Distance (Feet): 200 Feet Assistive device: Rolling walker (2 wheeled) Gait Pattern/deviations: (Hop through step pattern) Gait velocity: Decreased   General Gait Details: Pt ambualted using a RW with MinG-Supervision and a  hop-through gait pattern to follow NWB precautions. Pt took appropriate step length, and pushed through BUE without difficulty. Pt stood up tall and was able to maintain conversation during ambulation. Pt had no visible LOB, and slight exertion noted by PT, but it did not seem to affect mobility.   Stairs            Wheelchair Mobility    Modified Rankin (Stroke Patients Only)       Balance Overall balance assessment: Needs assistance Sitting-balance support: Feet supported;No upper extremity supported Sitting balance-Leahy Scale: Good Sitting balance - Comments: Pt was able to raise arms overhead while PT put on gait belt, and adjust at EOB as needed while following precautions.    Standing balance support: No upper extremity supported;Bilateral upper extremity supported Standing balance-Leahy Scale: Fair Standing balance comment: Pt was able to perform Single Leg Stance without UE support for a few seconds before instructed by PT to return to bed as PT got RW. Pt was able to balance while following NWB precautions using RW without difficulty.                              Pertinent Vitals/Pain Pain Assessment: 0-10 Pain Score: 7  Pain Location: L Knee, particularly around knee cap Pain Descriptors / Indicators: Grimacing;Guarding Pain Intervention(s): Limited activity within patient's tolerance;Monitored during session;Repositioned    Home Living Family/patient expects to be discharged to:: Private residence Living Arrangements: Alone Available Help at Discharge: Neighbor Type of Home: Apartment Home Access: Level entry     Home Layout: One level Home Equipment: None Additional Comments: Pt educated  on the benefits of a shower chair.     Prior Function Level of Independence: Independent         Comments: Pt reports mountain biking and skateboarding regularly      Hand Dominance   Dominant Hand: Right    Extremity/Trunk Assessment   Upper  Extremity Assessment Upper Extremity Assessment: Overall WFL for tasks assessed    Lower Extremity Assessment Lower Extremity Assessment: LLE deficits/detail LLE Deficits / Details: post tib/fib, and posterior malleolus fracture and surgical fixation LLE: Unable to fully assess due to immobilization    Cervical / Trunk Assessment Cervical / Trunk Assessment: Normal  Communication   Communication: No difficulties  Cognition Arousal/Alertness: Awake/alert Behavior During Therapy: WFL for tasks assessed/performed Overall Cognitive Status: Within Functional Limits for tasks assessed                                 General Comments: Pt is positive, follows instructions well, and eager to move.       General Comments General comments (skin integrity, edema, etc.): Pt is compliant and expresses desire to move. Pt follows instructions well. Pt is curious about showering with his incision; PT recommended following up with pt's surgeon. PT recommended that pt find assistance to take care of his dog while in recovery.     Exercises     Assessment/Plan    PT Assessment Patent does not need any further PT services  PT Problem List         PT Treatment Interventions      PT Goals (Current goals can be found in the Care Plan section)  Acute Rehab PT Goals Patient Stated Goal: To go home and "see his dog" PT Goal Formulation: With patient Time For Goal Achievement: 02/26/18 Potential to Achieve Goals: Good    Frequency     Barriers to discharge        Co-evaluation               AM-PAC PT "6 Clicks" Daily Activity  Outcome Measure Difficulty turning over in bed (including adjusting bedclothes, sheets and blankets)?: None Difficulty moving from lying on back to sitting on the side of the bed? : None Difficulty sitting down on and standing up from a chair with arms (e.g., wheelchair, bedside commode, etc,.)?: Unable Help needed moving to and from a bed to  chair (including a wheelchair)?: A Little Help needed walking in hospital room?: A Little Help needed climbing 3-5 steps with a railing? : A Little 6 Click Score: 18    End of Session Equipment Utilized During Treatment: Gait belt Activity Tolerance: Patient tolerated treatment well Patient left: in chair;with call bell/phone within reach;with chair alarm set Nurse Communication: Mobility status PT Visit Diagnosis: Other abnormalities of gait and mobility (R26.89);History of falling (Z91.81);Difficulty in walking, not elsewhere classified (R26.2);Pain Pain - Right/Left: Left Pain - part of body: Knee    Time: 5093-2671 PT Time Calculation (min) (ACUTE ONLY): 17 min   Charges:   PT Evaluation $PT Eval Low Complexity: 1 Low     PT G Codes:       Elwin Mocha, S-DPT Acute Care Rehab Student [phone]   02/12/2018, 11:48 AM

## 2018-02-12 NOTE — Discharge Instructions (Signed)
[facility] Discharge Instructions   General Discharge Instructions  WEIGHT BEARING STATUS: Nonweightbearing to left leg  RANGE OF MOTION/ACTIVITY: No restrictions on motion for knee  Wound Care: Keep splint that is in place clean, dry and intact until follow up in 2 weeks  DVT/PE prophylaxis: Take a daily aspirin 364m once a day  Diet: as you were eating previously.  Can use over the counter stool softeners and bowel preparations, such as Miralax, to help with bowel movements.  Narcotics can be constipating.  Be sure to drink plenty of fluids  PAIN MEDICATION USE AND EXPECTATIONS  You have likely been given narcotic medications to help control your pain.  After a traumatic event that results in an fracture (broken bone) with or without surgery, it is ok to use narcotic pain medications to help control one's pain.  We understand that everyone responds to pain differently and each individual patient will be evaluated on a regular basis for the continued need for narcotic medications. Ideally, narcotic medication use should last no more than 6-8 weeks (coinciding with fracture healing).   As a patient it is your responsibility as well to monitor narcotic medication use and report the amount and frequency you use these medications when you come to your office visit.   We would also advise that if you are using narcotic medications, you should take a dose prior to therapy to maximize you participation.  IF YOU ARE ON NARCOTIC MEDICATIONS IT IS NOT PERMISSIBLE TO OPERATE A MOTOR VEHICLE (MOTORCYCLE/CAR/TRUCK/MOPED) OR HEAVY MACHINERY DO NOT MIX NARCOTICS WITH OTHER CNS (CENTRAL NERVOUS SYSTEM) DEPRESSANTS SUCH AS ALCOHOL   STOP SMOKING OR USING NICOTINE PRODUCTS!!!!  As discussed nicotine severely impairs your body's ability to heal surgical and traumatic wounds but also impairs bone healing.  Wounds and bone heal by forming microscopic blood vessels (angiogenesis) and nicotine is  a vasoconstrictor (essentially, shrinks blood vessels).  Therefore, if vasoconstriction occurs to these microscopic blood vessels they essentially disappear and are unable to deliver necessary nutrients to the healing tissue.  This is one modifiable factor that you can do to dramatically increase your chances of healing your injury.    (This means no smoking, no nicotine gum, patches, etc)  DO NOT USE NONSTEROIDAL ANTI-INFLAMMATORY DRUGS (NSAID'S)  Using products such as Advil (ibuprofen), Aleve (naproxen), Motrin (ibuprofen) for additional pain control during fracture healing can delay and/or prevent the healing response.  If you would like to take over the counter (OTC) medication, Tylenol (acetaminophen) is ok.  However, some narcotic medications that are given for pain control contain acetaminophen as well. Therefore, you should not exceed more than 4000 mg of tylenol in a day if you do not have liver disease.  Also note that there are may OTC medicines, such as cold medicines and allergy medicines that my contain tylenol as well.  If you have any questions about medications and/or interactions please ask your doctor/PA or your pharmacist.      ICE AND ELEVATE INJURED/OPERATIVE EXTREMITY  Using ice and elevating the injured extremity above your heart can help with swelling and pain control.  Icing in a pulsatile fashion, such as 20 minutes on and 20 minutes off, can be followed.    Do not place ice directly on skin. Make sure there is a barrier between to skin and the ice pack.    Using frozen items such as frozen peas works well as the conform nicely to the are that needs to be iced.  IF YOU ARE IN A SPLINT OR CAST DO NOT REMOVE IT FOR ANY REASON   If your splint gets wet for any reason please contact the office immediately. You may shower in your splint or cast as long as you keep it dry.  This can be done by wrapping in a cast cover or garbage back (or similar)  Do Not stick any thing down your  splint or cast such as pencils, money, or hangers to try and scratch yourself with.  If you feel itchy take benadryl as prescribed on the bottle for itching  CALL THE OFFICE WITH ANY QUESTIONS OR CONCERNS: [phone]

## 2019-06-10 IMAGING — RF DG TIBIA/FIBULA 2V*L*
1 series · 9 of 9 positions shown · non-contrast
Comparison: Radiographs dated 02/10/2018 and CT scan dated
02/11/2018

CLINICAL DATA: Fractures of the distal tibia and fibula.

EXAM:
DG C-ARM 61-120 MIN; LEFT TIBIA AND FIBULA - 2 VIEW

[Series 1: run · 9 of 9 slices shown]
[im 1/9]
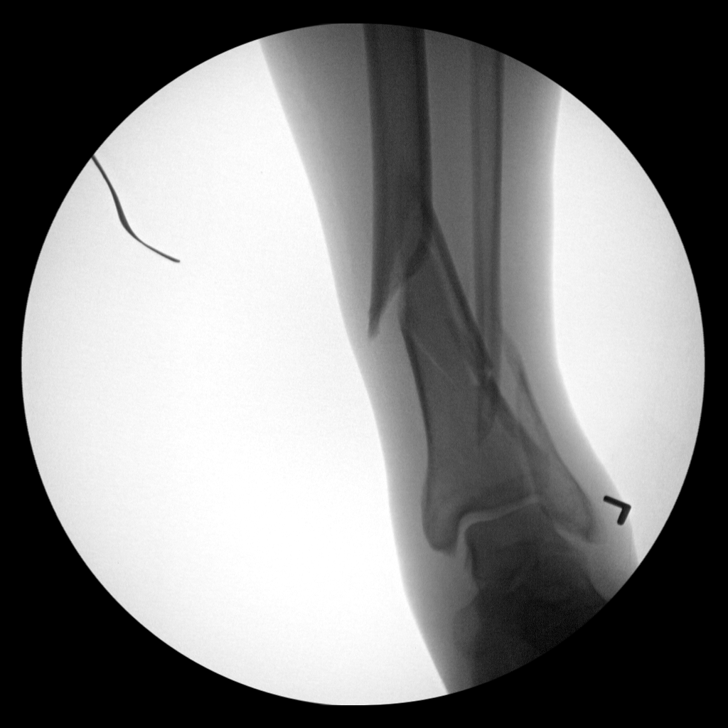
[im 2/9]
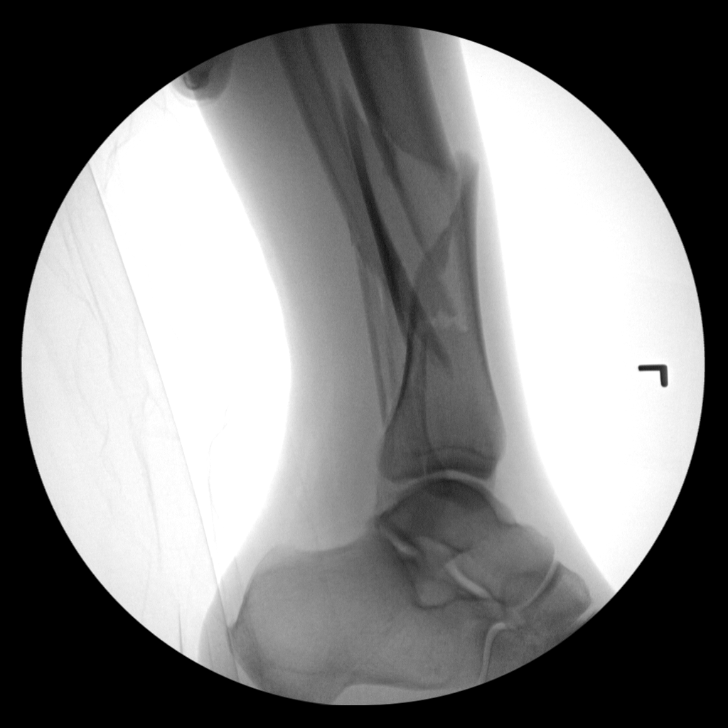
[im 3/9]
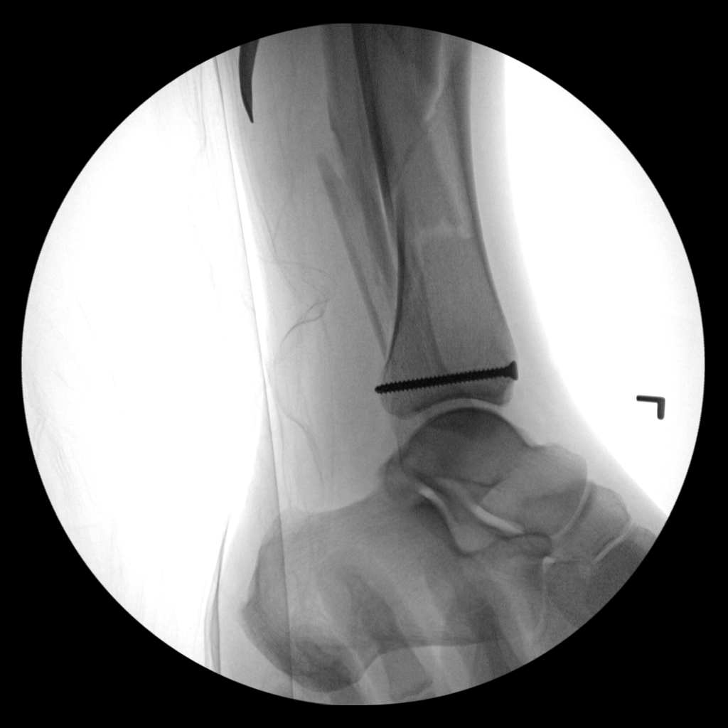
[im 4/9]
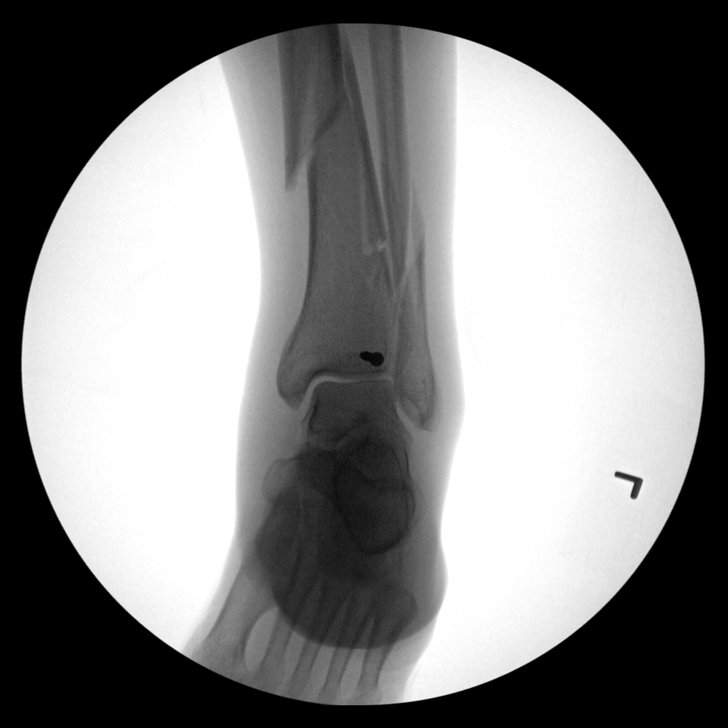
[im 5/9]
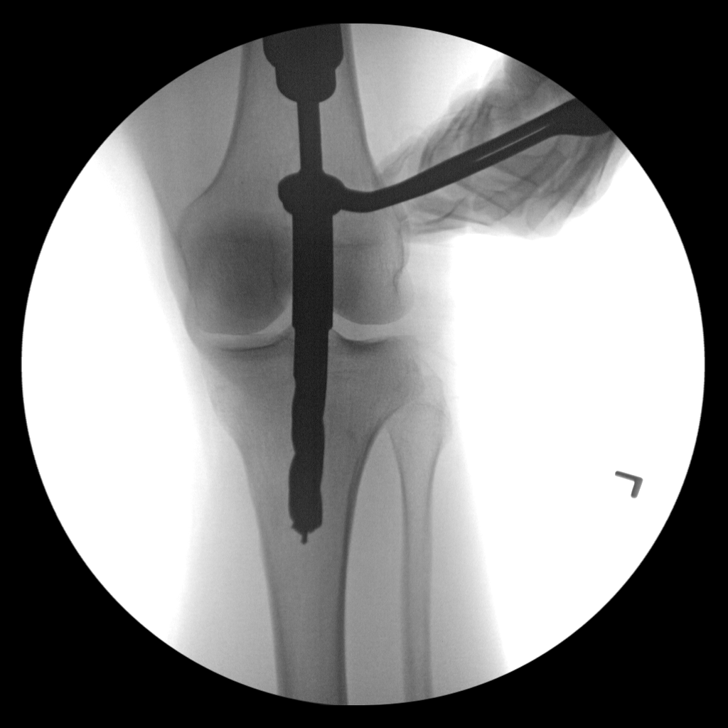
[im 6/9]
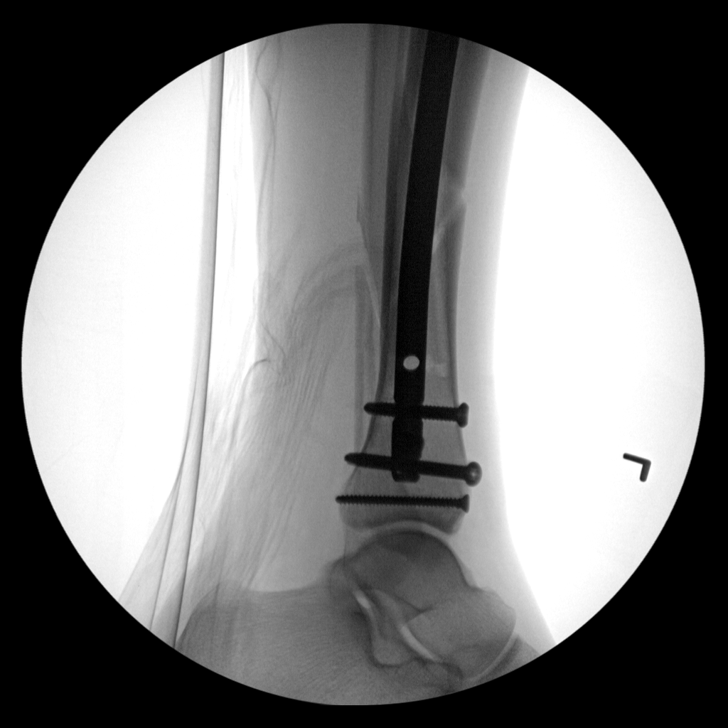
[im 7/9]
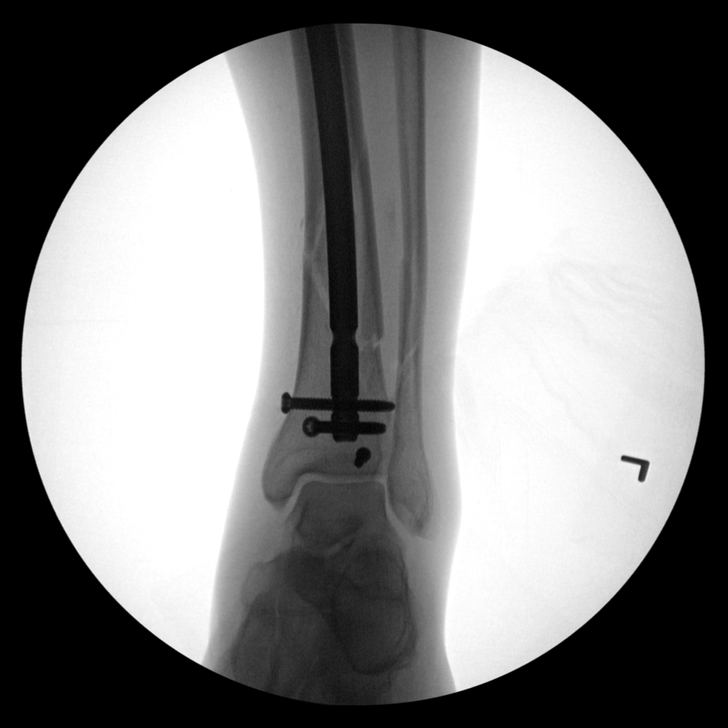
[im 8/9]
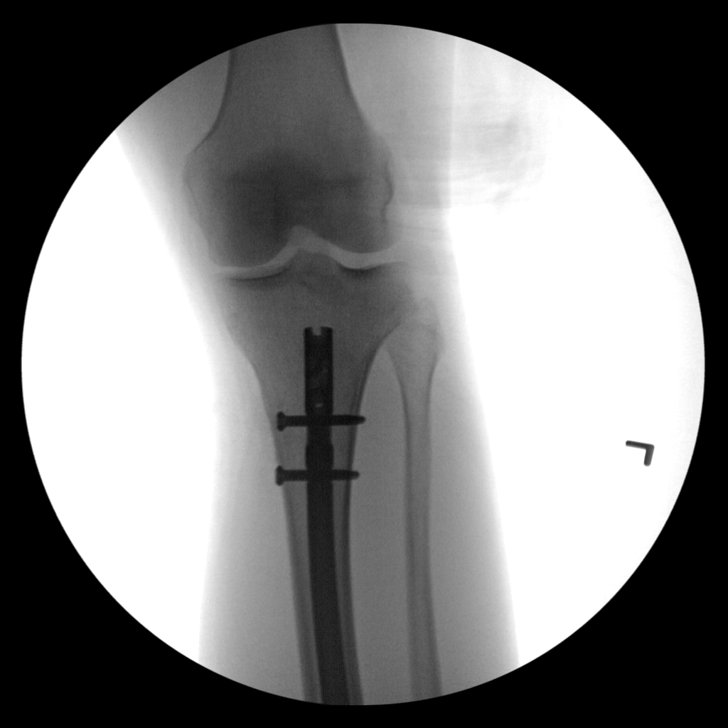
[im 9/9]
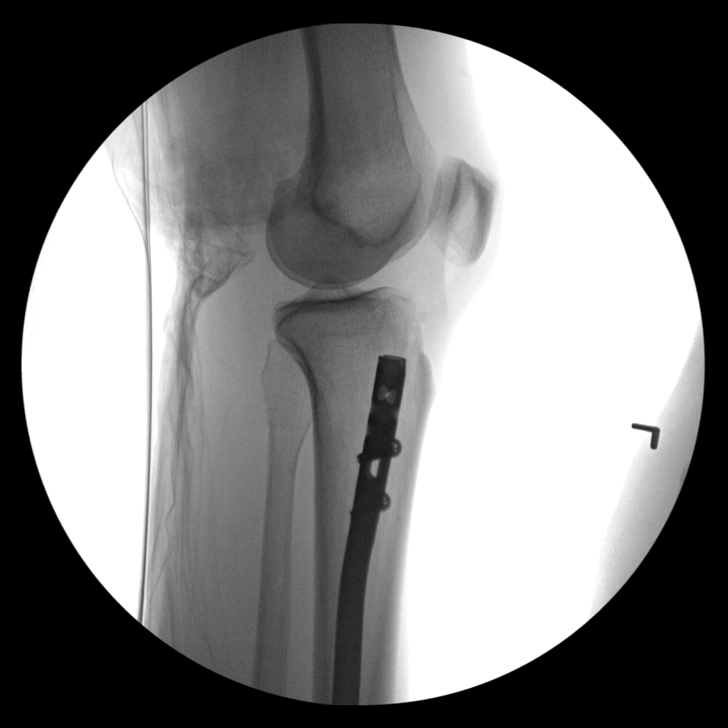

[9 of 9 positions shown; findings below may reference images not displayed]

FINDINGS: Multiple C-arm images demonstrate the patient undergoing open
reduction and internal fixation of the distal tibia fractures.
Tibial nail inserted with distal fixation screws. Alignment of major
fracture fragments is near anatomic. Marked improvement in the
alignment and position of the distal fibular fractures.

Single screw placed in AP direction through the distal tibia for
fixation of the posterior malleolar fracture.
IMPRESSION: Satisfactory appearance of the right lower leg and ankle after open
reduction and internal fixation as described.

FLUOROSCOPY TIME:  3 minutes 4 seconds

C-arm fluoroscopic images were obtained intraoperatively and
submitted for post operative interpretation.

## 2019-06-10 IMAGING — CT CT ANKLE*L* W/O CM
3 series · 12 of 33 positions shown, 14 images · non-contrast
Comparison: Radiographs dated 02/10/2018

CLINICAL DATA: Fractures of the distal tibia and fibula due to
skateboarding accident on 02/10/2018.

EXAM:
CT OF THE LEFT ANKLE WITHOUT CONTRAST
TECHNIQUE: Multidetector CT imaging of the left ankle was performed according
to the standard protocol. Multiplanar CT image reconstructions were
also generated.

[Series 5: lower ext 1.5 st · axial · 0.29mm/px · z∈[+18,+181]mm · 4 of 159 slices shown, 5 images]
[im 25/159  soft-tissue]
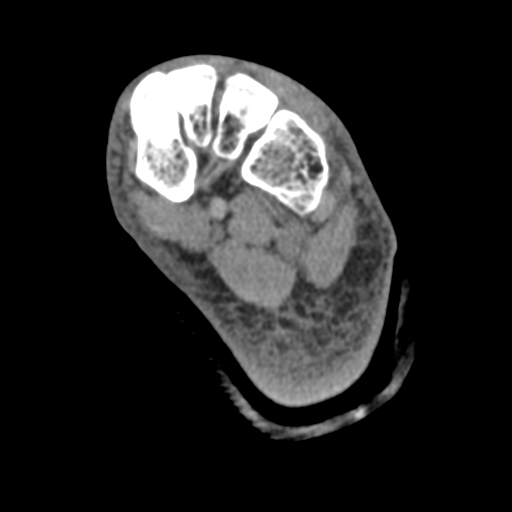
[im 25/159  bone]
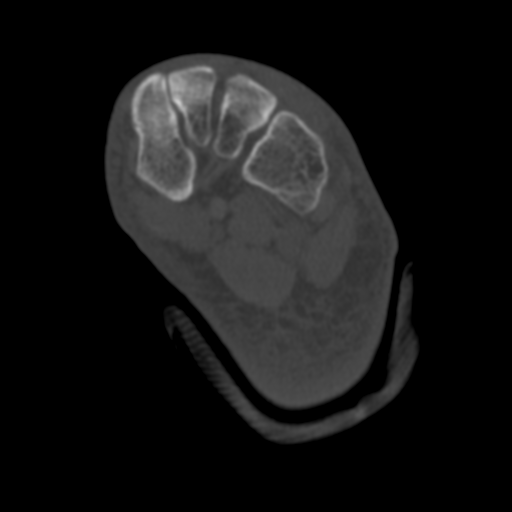
[im 61/159  bone]
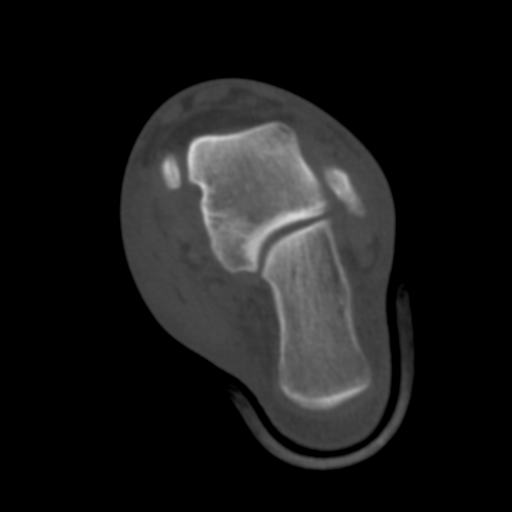
[im 98/159  bone]
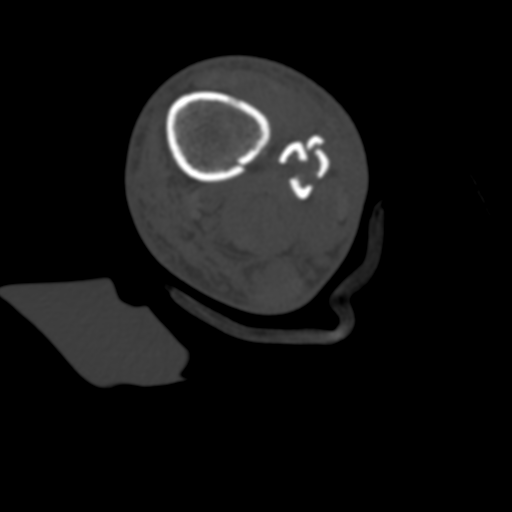
[im 134/159  bone]
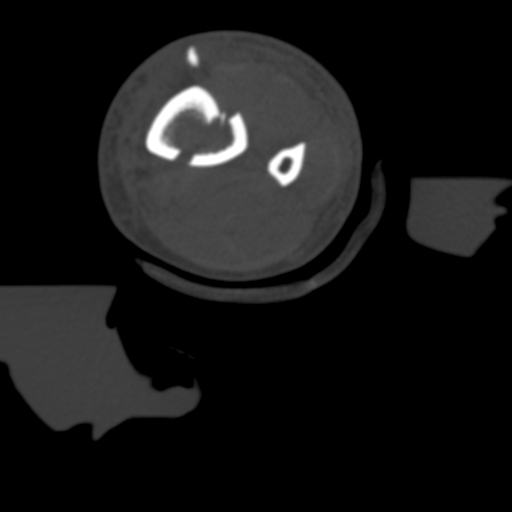

[Series 10: lower ext cor st · coronal · 0.28mm/px · 3 of 96 slices shown]
[im 20/96  bone]
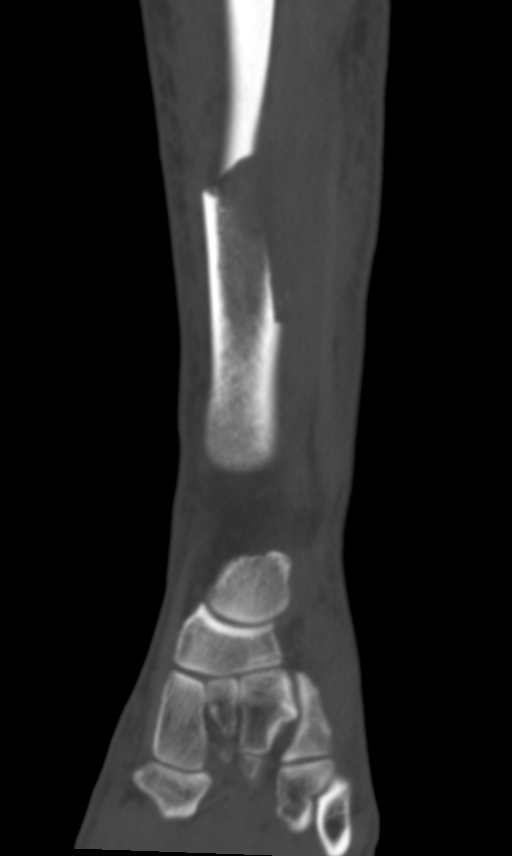
[im 39/96  bone]
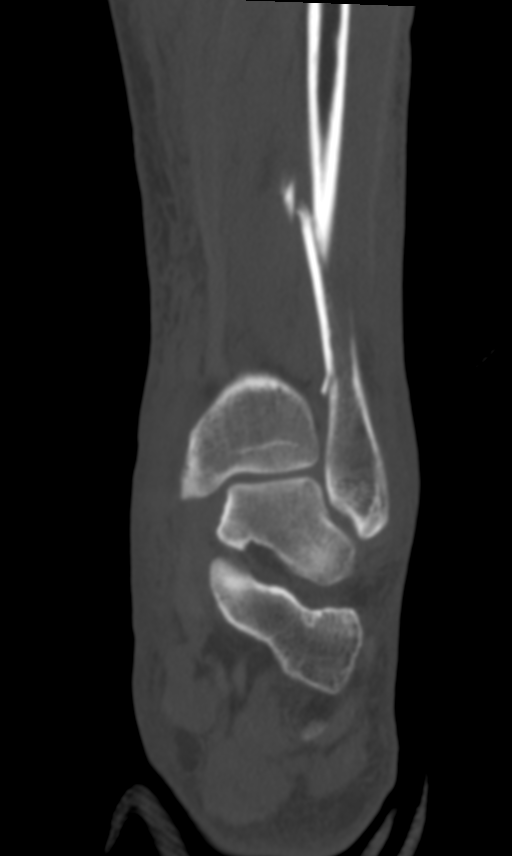
[im 58/96  bone]
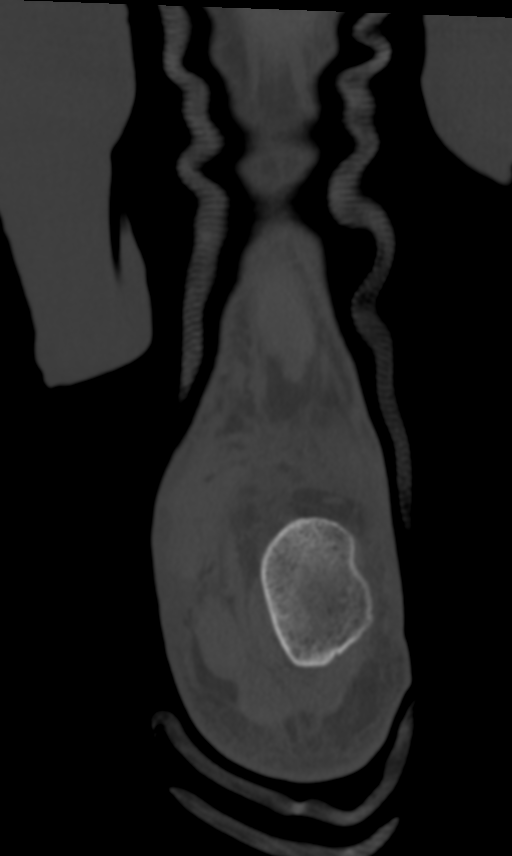

[Series 11: lower ext sag st · sagittal · 0.32mm/px · 5 of 80 slices shown, 6 images]
[im 27/80  bone]
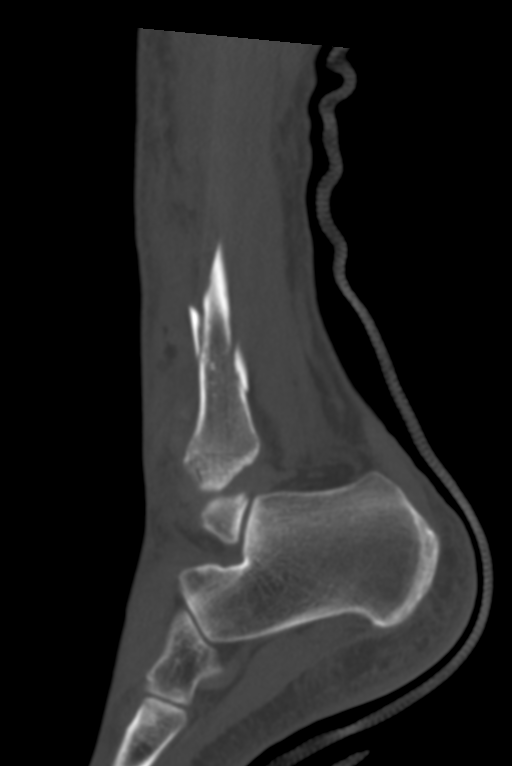
[im 33/80  bone]
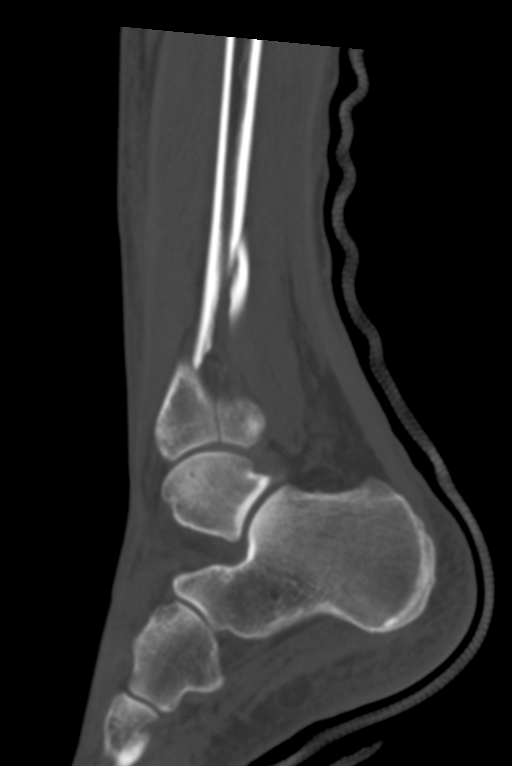
[im 40/80  soft-tissue]
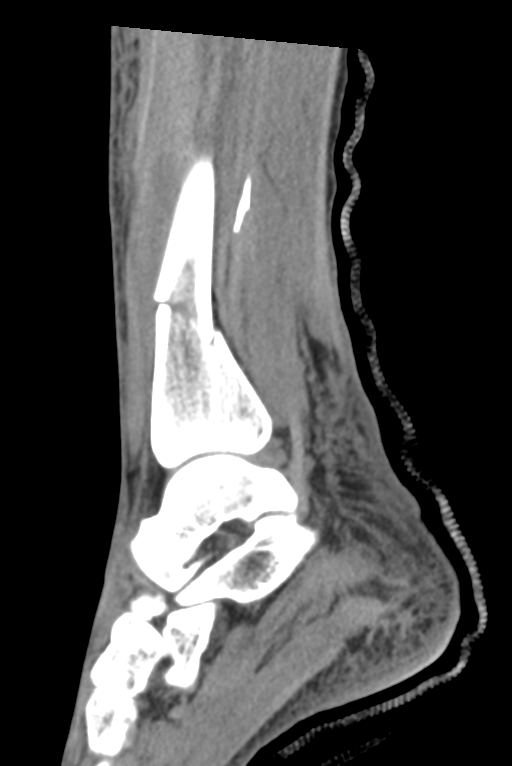
[im 40/80  bone]
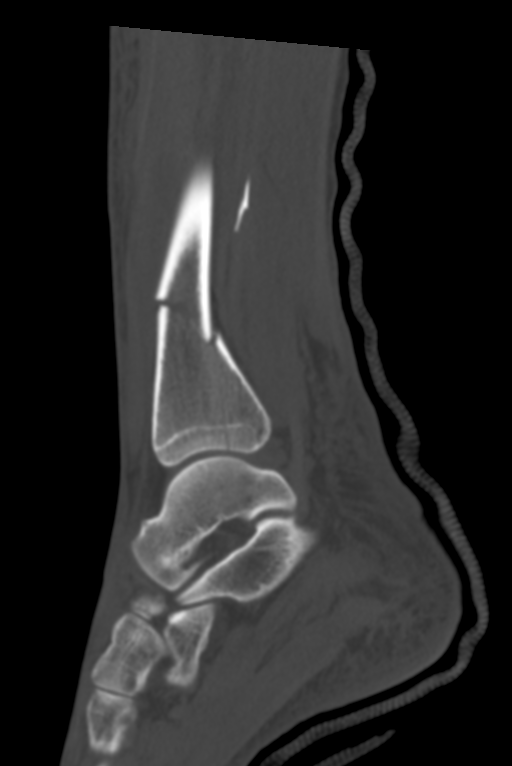
[im 47/80  bone]
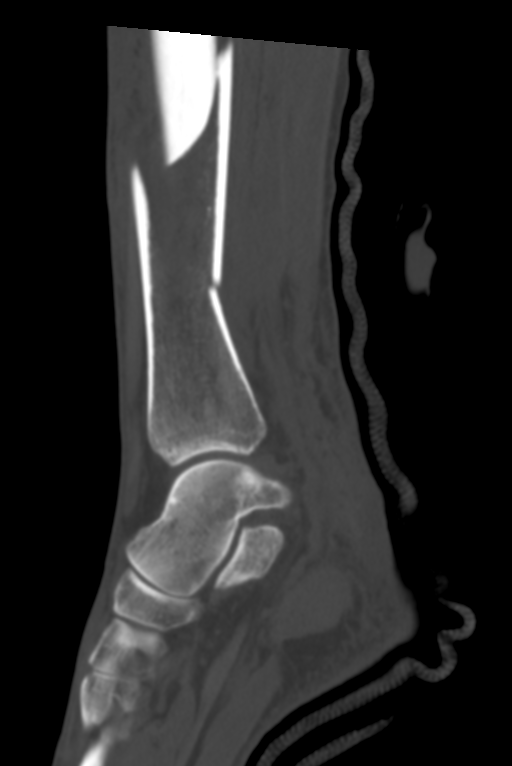
[im 53/80  bone]
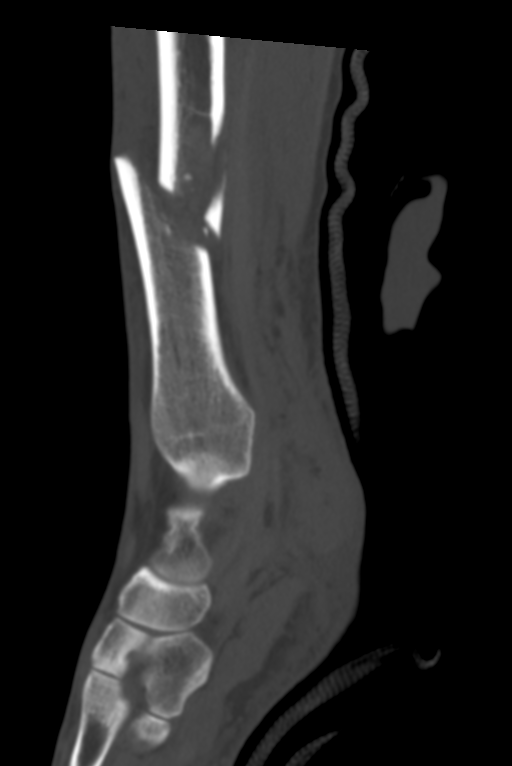

[12 of 33 positions shown; findings below may reference images not displayed]

FINDINGS: Bones/Joint/Cartilage

There is a nondisplaced posterior malleolar fracture best seen on
series 4 and series 9. At maximum this involves approximately 35% of
the lateral aspect of the articular surface of the distal tibia.

There are comminuted spiral fractures of the distal tibia and fibula
with slight displacement and slight angulation and overriding of the
fracture fragments. There is no abnormal widening of the distal
tibiofibular syndesmosis.

Ligaments

Suboptimally assessed by CT. The deltoid ligament is intact. The
lateral ligaments also appear to be intact.

Muscles and Tendons

The tendons around the ankle are intact. No discrete muscle
abnormality.

Soft tissues

Circumferential edema at the ankle and lower leg with hemorrhage
into the soft tissues at the medial aspect of the lower leg and
ankle.
IMPRESSION: 1. Nondisplaced posterior malleolar fracture of the distal tibia.
2. Comminuted spiral fractures of distal shafts of the tibia and
fibula as described.
